# Patient Record
Sex: Female | Born: 1995 | Hispanic: Yes | Marital: Married | Smoking: Never smoker
Health system: Southern US, Community
[De-identification: ages and names within clinical notes are randomized; demographics above are authoritative.]

## PROBLEM LIST (undated history)

## (undated) DIAGNOSIS — E282 Polycystic ovarian syndrome: Secondary | ICD-10-CM

## (undated) DIAGNOSIS — J45909 Unspecified asthma, uncomplicated: Secondary | ICD-10-CM

## (undated) DIAGNOSIS — O139 Gestational [pregnancy-induced] hypertension without significant proteinuria, unspecified trimester: Secondary | ICD-10-CM

## (undated) HISTORY — PX: EYE SURGERY: SHX253

## (undated) HISTORY — DX: Gestational (pregnancy-induced) hypertension without significant proteinuria, unspecified trimester: O13.9

## (undated) HISTORY — DX: Polycystic ovarian syndrome: E28.2

---
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
## 2017-03-25 ENCOUNTER — Ambulatory Visit (INDEPENDENT_AMBULATORY_CARE_PROVIDER_SITE_OTHER): Payer: Medicaid Other | Admitting: Obstetrics

## 2017-03-25 ENCOUNTER — Other Ambulatory Visit: Payer: Self-pay

## 2017-03-25 ENCOUNTER — Encounter: Payer: Self-pay | Admitting: Obstetrics

## 2017-03-25 ENCOUNTER — Other Ambulatory Visit (HOSPITAL_COMMUNITY)
Admission: RE | Admit: 2017-03-25 | Discharge: 2017-03-25 | Disposition: A | Discharge status: Home / Self Care | Payer: Medicaid Other | Source: Ambulatory Visit | Attending: Obstetrics | Admitting: Obstetrics

## 2017-03-25 VITALS — BP 118/81 | HR 81 | Ht 71.0 in | Wt 232.0 lb

## 2017-03-25 DIAGNOSIS — Z3009 Encounter for other general counseling and advice on contraception: Secondary | ICD-10-CM

## 2017-03-25 DIAGNOSIS — E669 Obesity, unspecified: Secondary | ICD-10-CM

## 2017-03-25 DIAGNOSIS — Z01419 Encounter for gynecological examination (general) (routine) without abnormal findings: Secondary | ICD-10-CM

## 2017-03-25 NOTE — Progress Notes (Signed)
New GYN, presents to establish care. [facility]. Last unprotected sex was 02/13/17.  UPT today is

## 2017-03-25 NOTE — Progress Notes (Signed)
Subjective:        Danielle Padilla is a 21 y.o. female here for a routine exam.  Current complaints: None.    Personal health questionnaire:  Is patient Ashkenazi Jewish, have a family history of breast and/or ovarian cancer: no Is there a family history of uterine cancer diagnosed at age < 5250, gastrointestinal cancer, urinary tract cancer, family member who is a Personnel officerLynch syndrome-associated carrier: no Is the patient overweight and hypertensive, family history of diabetes, personal history of gestational diabetes, preeclampsia or PCOS: no Is patient over 3355, have PCOS,  family history of premature CHD under age 20, diabetes, smoke, have hypertension or peripheral artery disease:  no At any time, has a partner hit, kicked or otherwise hurt or frightened you?: no Over the past 2 weeks, have you felt down, depressed or hopeless?: no Over the past 2 weeks, have you felt little interest or pleasure in doing things?:no   Gynecologic History No LMP recorded. Contraception: condoms Last Pap: n/a. Results were: n/a Last mammogram: n/a. Results were: n/a  Obstetric History OB History  Gravida Para Term Preterm AB Living  0 0 0 0 0 0  SAB TAB Ectopic Multiple Live Births  0 0 0 0 0        Past Medical History:  Diagnosis Date  . Medical history non-contributory     Past Surgical History:  Procedure Laterality Date  . EYE SURGERY      No current outpatient medications on file. No Known Allergies  Social History   Tobacco Use  . Smoking status: Never Smoker  . Smokeless tobacco: Never Used  Substance Use Topics  . Alcohol use: No    Frequency: Never    Family History  Problem Relation Age of Onset  . Diabetes Mother   . Asthma Father   . Hypertension Father   . Asthma Brother   . Cancer Maternal Uncle   . Cancer Maternal Grandmother   . Diabetes Paternal Grandmother       Review of Systems  Constitutional: negative for fatigue and weight  loss Respiratory: negative for cough and wheezing Cardiovascular: negative for chest pain, fatigue and palpitations Gastrointestinal: negative for abdominal pain and change in bowel habits Musculoskeletal:negative for myalgias Neurological: negative for gait problems and tremors Behavioral/Psych: negative for abusive relationship, depression Endocrine: negative for temperature intolerance    Genitourinary:negative for abnormal menstrual periods, genital lesions, hot flashes, sexual problems and vaginal discharge Integument/breast: negative for breast lump, breast tenderness, nipple discharge and skin lesion(s)    Objective:       BP 118/81   Pulse 81   Ht 5\' 11"  (1.803 m)   Wt 232 lb (105.2 kg)   BMI 32.36 kg/m  General:   alert  Skin:   no rash or abnormalities  Lungs:   clear to auscultation bilaterally  Heart:   regular rate and rhythm, S1, S2 normal, no murmur, click, rub or gallop  Breasts:   normal without suspicious masses, skin or nipple changes or axillary nodes  Abdomen:  normal findings: no organomegaly, soft, non-tender and no hernia  Pelvis:  External genitalia: normal general appearance Urinary system: urethral meatus normal and bladder without fullness, nontender Vaginal: normal without tenderness, induration or masses Cervix: normal appearance Adnexa: normal bimanual exam Uterus: anteverted and non-tender, normal size   Lab Review Urine pregnancy test Labs reviewed yes Radiologic studies reviewed no  50% of 20 min visit spent on counseling and coordination of care.  Assessment and Plan:     1. Encounter for routine gynecological examination with Papanicolaou smear of cervix - doing well  2. Encounter for other general counseling and advice on contraception - wants Nexplanon  3. Obesity (BMI 30.0-34.9) - program that includes caloric reduction, exercise and behavioral modification recommended   Plan:    Education reviewed: calcium supplements,  depression evaluation, low fat, low cholesterol diet, safe sex/STD prevention, self breast exams, skin cancer screening and weight bearing exercise. Contraception: Nexplanon. Follow up in: 2 weeks.  Nexplanon Insertion.  No orders of the defined types were placed in this encounter.  No orders of the defined types were placed in this encounter.

## 2017-03-26 LAB — CERVICOVAGINAL ANCILLARY ONLY
Chlamydia: NEGATIVE
NEISSERIA GONORRHEA: NEGATIVE

## 2017-04-02 ENCOUNTER — Ambulatory Visit (INDEPENDENT_AMBULATORY_CARE_PROVIDER_SITE_OTHER): Payer: Medicaid Other | Admitting: Obstetrics

## 2017-04-02 ENCOUNTER — Encounter: Payer: Self-pay | Admitting: Obstetrics

## 2017-04-02 VITALS — BP 122/79 | HR 69 | Wt 230.0 lb

## 2017-04-02 DIAGNOSIS — Z309 Encounter for contraceptive management, unspecified: Secondary | ICD-10-CM

## 2017-04-02 DIAGNOSIS — Z01812 Encounter for preprocedural laboratory examination: Secondary | ICD-10-CM

## 2017-04-02 DIAGNOSIS — Z30017 Encounter for initial prescription of implantable subdermal contraceptive: Secondary | ICD-10-CM

## 2017-04-02 LAB — POCT URINE PREGNANCY: PREG TEST UR: NEGATIVE

## 2017-04-02 MED ORDER — ETONOGESTREL 68 MG ~~LOC~~ IMPL
68.0000 mg | DRUG_IMPLANT | Freq: Once | SUBCUTANEOUS | Status: AC
  Administered 2017-04-02: 68 mg via SUBCUTANEOUS

## 2017-04-02 NOTE — Progress Notes (Signed)
Nexplanon Procedure Note   PRE-OP DIAGNOSIS: desired long-term, reversible contraception ( LARC ) POST-OP DIAGNOSIS: Same  PROCEDURE: Nexplanon  placement Performing Provider: Brock BadHARLES A. Aizley Stenseth MD  Patient education prior to procedure, explained risk, benefits of Nexplanon, reviewed alternative options. Patient reported understanding. Gave consent to continue with procedure.   PROCEDURE:  Pregnancy Text :  Negative Site (check):      left arm         Sterile Preparation:   Betadinex3 Lot # [id number]   Expiration Date 01 / 2021  Insertion site was selected 8 - 10 cm from medial epicondyle and marked along with guiding site using sterile marker. Procedure area was prepped and draped in a sterile fashion. 1% Lidocaine 1.5 ml given prior to procedure. Nexplanon  was inserted subcutaneously.Needle was removed from the insertion site. Nexplanon capsule was palpated by provider and patient to assure satisfactory placement. Dressing applied.  Followup: The patient tolerated the procedure well without complications.  Standard post-procedure care is explained and return precautions are given.  Brock BadHARLES A. Mykaila Blunck MD

## 2017-04-03 ENCOUNTER — Encounter: Payer: Self-pay | Admitting: Obstetrics

## 2017-04-15 ENCOUNTER — Ambulatory Visit (INDEPENDENT_AMBULATORY_CARE_PROVIDER_SITE_OTHER): Payer: Medicaid Other | Admitting: Obstetrics

## 2017-04-15 ENCOUNTER — Encounter: Payer: Self-pay | Admitting: Obstetrics

## 2017-04-15 ENCOUNTER — Encounter: Payer: Self-pay | Admitting: *Deleted

## 2017-04-15 VITALS — BP 126/80 | HR 80 | Ht 71.0 in | Wt 228.6 lb

## 2017-04-15 DIAGNOSIS — Z309 Encounter for contraceptive management, unspecified: Secondary | ICD-10-CM | POA: Diagnosis not present

## 2017-04-15 DIAGNOSIS — Z3046 Encounter for surveillance of implantable subdermal contraceptive: Secondary | ICD-10-CM

## 2017-04-15 NOTE — Progress Notes (Signed)
Pt denies having any issues with nexplanon

## 2017-04-15 NOTE — Progress Notes (Signed)
Subjective:    Danielle Padilla is a 21 y.o. female who presents for contraception counseling. The patient has no complaints today. The patient is sexually active. Pertinent past medical history: none.  The information documented in the HPI was reviewed and verified.  Menstrual History: OB History    Gravida Para Term Preterm AB Living   0 0 0 0 0 0   SAB TAB Ectopic Multiple Live Births   0 0 0 0 0      No LMP recorded. Patient has had an implant.   There are no active problems to display for this patient.  Past Medical History:  Diagnosis Date  . Medical history non-contributory     Past Surgical History:  Procedure Laterality Date  . EYE SURGERY      No current outpatient medications on file. No Known Allergies  Social History   Tobacco Use  . Smoking status: Never Smoker  . Smokeless tobacco: Never Used  Substance Use Topics  . Alcohol use: No    Frequency: Never    Family History  Problem Relation Age of Onset  . Diabetes Mother   . Asthma Father   . Hypertension Father   . Asthma Brother   . Cancer Maternal Uncle   . Cancer Maternal Grandmother   . Diabetes Paternal Grandmother        Review of Systems Constitutional: negative for weight loss Genitourinary:negative for abnormal menstrual periods and vaginal discharge   Objective:   BP 126/80   Pulse 80   Ht 5\' 11"  (1.803 m)   Wt 228 lb 9.6 oz (103.7 kg)   BMI 31.88 kg/m    PE:          General:  Alert and no distress          Left Upper Extremity:  Nexplanon insertion site is clean, dry and non tender.  Rod palpated, intact.   Lab Review Urine pregnancy test Labs reviewed yes Radiologic studies reviewed no  50% of 15 min visit spent on counseling and coordination of care.    Assessment:    21 y.o., continuing Nexplanon, no contraindications.   Plan:    All questions answered. Follow up in 1 year.   No orders of the defined types were placed in this encounter.  No  orders of the defined types were placed in this encounter.

## 2018-02-03 ENCOUNTER — Ambulatory Visit: Payer: Medicaid Other | Admitting: Obstetrics & Gynecology

## 2018-04-02 ENCOUNTER — Encounter (HOSPITAL_COMMUNITY): Payer: Self-pay | Admitting: *Deleted

## 2018-04-02 ENCOUNTER — Ambulatory Visit (HOSPITAL_COMMUNITY)
Admission: EM | Admit: 2018-04-02 | Discharge: 2018-04-02 | Disposition: A | Discharge status: Home / Self Care | Payer: Self-pay | Attending: Family Medicine | Admitting: Family Medicine

## 2018-04-02 DIAGNOSIS — N390 Urinary tract infection, site not specified: Secondary | ICD-10-CM

## 2018-04-02 HISTORY — DX: Unspecified asthma, uncomplicated: J45.909

## 2018-04-02 LAB — POCT URINALYSIS DIP (DEVICE)
BILIRUBIN URINE: NEGATIVE
GLUCOSE, UA: NEGATIVE mg/dL
Ketones, ur: NEGATIVE mg/dL
Nitrite: POSITIVE — AB
Protein, ur: 100 mg/dL — AB
Specific Gravity, Urine: 1.025 (ref 1.005–1.030)
UROBILINOGEN UA: 0.2 mg/dL (ref 0.0–1.0)
pH: 6 (ref 5.0–8.0)

## 2018-04-02 LAB — POCT PREGNANCY, URINE: Preg Test, Ur: NEGATIVE

## 2018-04-02 MED ORDER — NITROFURANTOIN MONOHYD MACRO 100 MG PO CAPS: 100.0000 mg | ORAL_CAPSULE | Freq: Two times a day (BID) | ORAL | 0 refills | Status: AC

## 2018-04-02 NOTE — ED Triage Notes (Signed)
Patient reports progressively worsening dysuria, lower abdominal pain radiating to left flank, and starting yesterday hematuria. Patient states that she has increased PO intake but the pain was extreme this am causing her to wake up at 4a. No fever.

## 2018-04-02 NOTE — ED Provider Notes (Signed)
[facility]    CSN: [id number] Arrival date & time: 04/02/18  4098     History   Chief Complaint Chief Complaint  Patient presents with  . Dysuria  . Abdominal Pain  . Hematuria    HPI Latesia Virgilio Belling is a 22 y.o. female.   Kameela presents with complaints of burning with urination, some pelvic pain. Started two days ago. Blood noted to urine yesterday. Denies any previous similar. No nausea or vomiting. Mild left low back pain. No fevers. LMP in July, recently had her implant removed. No vaginal symptoms. Took AZO yesterday which did help. Hx of asthma.     ROS per HPI.      Past Medical History:  Diagnosis Date  . Asthma   . Medical history non-contributory     There are no active problems to display for this patient.   Past Surgical History:  Procedure Laterality Date  . EYE SURGERY      OB History    Gravida  0   Para  0   Term  0   Preterm  0   AB  0   Living  0     SAB  0   TAB  0   Ectopic  0   Multiple  0   Live Births  0            Home Medications    Prior to Admission medications   Medication Sig Start Date End Date Taking? Authorizing Provider  nitrofurantoin, macrocrystal-monohydrate, (MACROBID) 100 MG capsule Take 1 capsule (100 mg total) by mouth 2 (two) times daily for 5 days. 04/02/18 04/07/18  Georgetta Haber, NP    Family History Family History  Problem Relation Age of Onset  . Diabetes Mother   . Asthma Father   . Hypertension Father   . Asthma Brother   . Cancer Maternal Uncle   . Cancer Maternal Grandmother   . Diabetes Paternal Grandmother     Social History Social History   Tobacco Use  . Smoking status: Never Smoker  . Smokeless tobacco: Never Used  Substance Use Topics  . Alcohol use: No    Frequency: Never  . Drug use: No     Allergies   Patient has no known allergies.   Review of Systems Review of Systems   Physical Exam Triage Vital Signs ED Triage  Vitals  Enc Vitals Group     BP 04/02/18 0909 127/85     Pulse Rate 04/02/18 0909 89     Resp 04/02/18 0909 17     Temp 04/02/18 0909 (!) 97.5 F (36.4 C)     Temp Source 04/02/18 0909 Oral     SpO2 04/02/18 0909 99 %     Weight --      Height --      Head Circumference --      Peak Flow --      Pain Score 04/02/18 0910 7     Pain Loc --      Pain Edu? --      Excl. in GC? --    No data found.  Updated Vital Signs BP 127/85 (BP Location: Left Arm)   Pulse 89   Temp (!) 97.5 F (36.4 C) (Oral)   Resp 17   SpO2 99%   Physical Exam  Constitutional: She is oriented to person, place, and time. She appears well-developed and well-nourished. No distress.  Cardiovascular: Normal rate, regular rhythm and  normal heart sounds.  Pulmonary/Chest: Effort normal and breath sounds normal.  Abdominal: Soft. There is no tenderness. There is no rigidity, no rebound, no guarding and no CVA tenderness.  Neurological: She is alert and oriented to person, place, and time.  Skin: Skin is warm and dry.     UC Treatments / Results  Labs (all labs ordered are listed, but only abnormal results are displayed) Labs Reviewed  POCT URINALYSIS DIP (DEVICE) - Abnormal; Notable for the following components:      Result Value   Hgb urine dipstick LARGE (*)    Protein, ur 100 (*)    Nitrite POSITIVE (*)    Leukocytes, UA MODERATE (*)    All other components within normal limits  POCT PREGNANCY, URINE    EKG None  Radiology No results found.  Procedures Procedures (including critical care time)  Medications Ordered in UC Medications - No data to display  Initial Impression / Assessment and Plan / UC Course  I have reviewed the triage vital signs and the nursing notes.  Pertinent labs & imaging results that were available during my care of the patient were reviewed by me and considered in my medical decision making (see chart for details).     UA and history consistent with UTI.  Course of macrobid provided. Return precautions provided. If symptoms worsen or do not improve in the next week to return to be seen or to follow up with PCP.  Patient verbalized understanding and agreeable to plan.   Final Clinical Impressions(s) / UC Diagnoses   Final diagnoses:  Lower urinary tract infectious disease     Discharge Instructions     Drink plenty of water to empty bladder regularly. Avoid alcohol and caffeine as these may irritate the bladder.  Complete course of antibiotics.  If symptoms worsen or do not improve in the next week to return to be seen or to follow up with your PCP.     ED Prescriptions    Medication Sig Dispense Auth. Provider   nitrofurantoin, macrocrystal-monohydrate, (MACROBID) 100 MG capsule Take 1 capsule (100 mg total) by mouth 2 (two) times daily for 5 days. 10 capsule Georgetta HaberBurky, Nitya Cauthon B, NP     Controlled Substance Prescriptions [place] Controlled Substance Registry consulted? Not Applicable   Georgetta HaberBurky, Pablo Mathurin B, NP 04/02/18 1202

## 2018-04-02 NOTE — Discharge Instructions (Signed)
Drink plenty of water to empty bladder regularly. Avoid alcohol and caffeine as these may irritate the bladder.  Complete course of antibiotics.  If symptoms worsen or do not improve in the next week to return to be seen or to follow up with your PCP.   

## 2018-05-14 ENCOUNTER — Other Ambulatory Visit: Payer: Self-pay | Admitting: Family Medicine

## 2018-05-14 MED ORDER — NITROFURANTOIN MONOHYD MACRO 100 MG PO CAPS: 100.0000 mg | ORAL_CAPSULE | Freq: Two times a day (BID) | ORAL | 0 refills | Status: DC

## 2018-05-14 NOTE — Progress Notes (Signed)
Microbid started for recurrent uti

## 2018-06-21 ENCOUNTER — Ambulatory Visit (INDEPENDENT_AMBULATORY_CARE_PROVIDER_SITE_OTHER): Payer: No Typology Code available for payment source | Admitting: Family Medicine

## 2018-06-21 ENCOUNTER — Other Ambulatory Visit: Payer: Self-pay

## 2018-06-21 ENCOUNTER — Encounter: Payer: Self-pay | Admitting: Family Medicine

## 2018-06-21 VITALS — BP 125/86 | HR 80 | Temp 98.7°F | Ht 68.0 in | Wt 247.8 lb

## 2018-06-21 DIAGNOSIS — N926 Irregular menstruation, unspecified: Secondary | ICD-10-CM | POA: Diagnosis not present

## 2018-06-21 DIAGNOSIS — Z113 Encounter for screening for infections with a predominantly sexual mode of transmission: Secondary | ICD-10-CM

## 2018-06-21 DIAGNOSIS — Z131 Encounter for screening for diabetes mellitus: Secondary | ICD-10-CM | POA: Diagnosis not present

## 2018-06-21 LAB — POCT URINE PREGNANCY: Preg Test, Ur: NEGATIVE

## 2018-06-21 NOTE — Patient Instructions (Signed)
° ° ° °  If you have lab work done today you will be contacted with your lab results within the next 2 weeks.  If you have not heard from us then please contact us. The fastest way to get your results is to register for My Chart. ° ° °IF you received an x-ray today, you will receive an invoice from [facility]. Please contact [facility] at [phone] with questions or concerns regarding your invoice.  ° °IF you received labwork today, you will receive an invoice from [facility]. Please contact [facility] at [phone] with questions or concerns regarding your invoice.  ° °Our billing staff will not be able to assist you with questions regarding bills from these companies. ° °You will be contacted with the lab results as soon as they are available. The fastest way to get your results is to activate your My Chart account. Instructions are located on the last page of this paperwork. If you have not heard from us regarding the results in 2 weeks, please contact this office. °  ° ° ° °

## 2018-06-21 NOTE — Progress Notes (Signed)
2/3/20203:43 PM  Danielle Padilla [birth year], 23 y.o. female [id number]  Chief Complaint  Patient presents with  . Establish Care    pt ttc, also wants sti testing. Wants to talk about the symptoms of pcos. Family member has this condition. Has not had cycle in a few months. Removed nexplanon 02/2018    HPI:   Patient is a 23 y.o. female who presents today with several concerns  Patient requesting STI testing, has no symptoms, just wanting to be thorough nexplanon removed in oct 2019, as she and her husband are trying to conceive G0 Prior to nexplanon she had irregular periods When she gets them they last week, normal menses Wondering about PCOS, her cousin has it Reports facial and chest chair and irregular periods On prenatal vitamins   Fall Risk  06/21/2018  Falls in the past year? 0  Number falls in past yr: 0  Injury with Fall? 0     Depression screen [facility] 2/9 06/21/2018 03/25/2017  Decreased Interest 0 0  Down, Depressed, Hopeless 0 0  PHQ - 2 Score 0 0  Altered sleeping - 0  Tired, decreased energy - 0  Change in appetite - 0  Feeling bad or failure about yourself  - 0  Trouble concentrating - 0  Moving slowly or fidgety/restless - 0  Suicidal thoughts - 0  PHQ-9 Score - 0  Difficult doing work/chores - Not difficult at all    No Known Allergies  Prior to Admission medications   Not on File    Past Medical History:  Diagnosis Date  . Asthma   . Medical history non-contributory     Past Surgical History:  Procedure Laterality Date  . EYE SURGERY      Social History   Tobacco Use  . Smoking status: Never Smoker  . Smokeless tobacco: Never Used  Substance Use Topics  . Alcohol use: No    Frequency: Never    Family History  Problem Relation Age of Onset  . Diabetes Mother   . Asthma Father   . Hypertension Father   . Asthma Brother   . Cancer Maternal Uncle   . Cancer Maternal Grandmother   . Diabetes Paternal Grandmother      ROS Per hpi  OBJECTIVE:  Blood pressure 125/86, pulse 80, temperature 98.7 F (37.1 C), temperature source Oral, height 5\' 8"  (1.727 m), weight 247 lb 12.8 oz (112.4 kg), last menstrual period 11/16/2017, SpO2 100 %. Body mass index is 37.68 kg/m.   Physical Exam Vitals signs and nursing note reviewed.  Constitutional:      Appearance: She is well-developed.  HENT:     Head: Normocephalic and atraumatic.  Eyes:     General: No scleral icterus.    Conjunctiva/sclera: Conjunctivae normal.     Pupils: Pupils are equal, round, and reactive to light.  Neck:     Musculoskeletal: Neck supple.  Pulmonary:     Effort: Pulmonary effort is normal.  Skin:    General: Skin is warm and dry.  Neurological:     Mental Status: She is alert and oriented to person, place, and time.       Results for orders placed or performed in visit on 06/21/18 (from the past 24 hour(s))  POCT urine pregnancy     Status: None   Collection Time: 06/21/18  3:52 PM  Result Value Ref Range   Preg Test, Ur Negative Negative   ASSESSMENT and PLAN  1. Routine screening for  STI (sexually transmitted infection) - HIV Antibody (routine testing w rflx) - RPR - GC/Chlamydia Probe Amp - Trichomonas vaginalis, RNA - Hepatitis C antibody  2. Irregular periods Clinically c/w PCOS. Labs to r/o other endo causes. Discussed possible referral to gyn given interest in fertility, pending labs - POCT urine pregnancy - TSH - Prolactin - Follicle Stimulating Hormone - 17-Hydroxyprogesterone; Future  3. Screening for diabetes mellitus (DM) - Hemoglobin A1c    Return if symptoms worsen or fail to improve.    Myles LippsIrma M Santiago, MD [facility] at [facility] [address] [place], [place] [address] Ph.  [phone] Fax [phone]

## 2018-06-22 LAB — HEPATITIS C ANTIBODY: Hep C Virus Ab: <0.1 s/co ratio (ref 0.0–0.9)

## 2018-06-22 LAB — HIV ANTIBODY (ROUTINE TESTING W REFLEX): HIV Screen 4th Generation wRfx: NONREACTIVE

## 2018-06-22 LAB — PROLACTIN: Prolactin: 19.4 ng/mL (ref 4.8–23.3)

## 2018-06-22 LAB — HEMOGLOBIN A1C
Est. average glucose Bld gHb Est-mCnc: 97 mg/dL
Hgb A1c MFr Bld: 5 % (ref 4.8–5.6)

## 2018-06-22 LAB — TSH: TSH: 1.4 u[IU]/mL (ref 0.450–4.500)

## 2018-06-22 LAB — FOLLICLE STIMULATING HORMONE: FSH: 4.2 m[IU]/mL

## 2018-06-22 LAB — RPR: RPR Ser Ql: NONREACTIVE

## 2018-06-25 ENCOUNTER — Encounter: Payer: Self-pay | Admitting: Family Medicine

## 2018-06-25 DIAGNOSIS — N926 Irregular menstruation, unspecified: Secondary | ICD-10-CM

## 2018-06-25 LAB — TRICHOMONAS VAGINALIS, PROBE AMP: Trich vag by NAA: NEGATIVE

## 2018-06-25 LAB — GC/CHLAMYDIA PROBE AMP
Chlamydia trachomatis, NAA: NEGATIVE
Neisseria gonorrhoeae by PCR: NEGATIVE

## 2018-07-03 ENCOUNTER — Ambulatory Visit (INDEPENDENT_AMBULATORY_CARE_PROVIDER_SITE_OTHER): Payer: No Typology Code available for payment source | Admitting: Family Medicine

## 2018-07-03 DIAGNOSIS — N926 Irregular menstruation, unspecified: Secondary | ICD-10-CM

## 2018-07-03 NOTE — Progress Notes (Signed)
Lab only visit 

## 2018-07-07 LAB — 17-HYDROXYPROGESTERONE: 17-Hydroxyprogesterone: 90 ng/dL

## 2018-07-07 LAB — TESTOSTERONE, FREE, TOTAL, SHBG
Sex Hormone Binding: 23.2 nmol/L — ABNORMAL LOW (ref 24.6–122.0)
Testosterone, Free: 3.9 pg/mL (ref 0.0–4.2)
Testosterone: 57 ng/dL — ABNORMAL HIGH (ref 8–48)

## 2018-07-07 LAB — DHEA-SULFATE: DHEA-SO4: 283.7 ug/dL (ref 110.0–431.7)

## 2018-07-08 ENCOUNTER — Ambulatory Visit (HOSPITAL_COMMUNITY)
Admission: RE | Admit: 2018-07-08 | Discharge: 2018-07-08 | Disposition: A | Discharge status: Home / Self Care | Payer: No Typology Code available for payment source | Source: Ambulatory Visit | Attending: Family Medicine | Admitting: Family Medicine

## 2018-07-08 DIAGNOSIS — N926 Irregular menstruation, unspecified: Secondary | ICD-10-CM | POA: Insufficient documentation

## 2018-07-12 ENCOUNTER — Encounter: Payer: Self-pay | Admitting: Family Medicine

## 2018-07-19 ENCOUNTER — Encounter: Payer: Self-pay | Admitting: Family Medicine

## 2018-07-19 ENCOUNTER — Other Ambulatory Visit: Payer: Self-pay

## 2018-07-19 ENCOUNTER — Ambulatory Visit (INDEPENDENT_AMBULATORY_CARE_PROVIDER_SITE_OTHER): Payer: No Typology Code available for payment source | Admitting: Family Medicine

## 2018-07-19 VITALS — BP 117/83 | HR 80 | Temp 98.8°F | Ht 68.0 in | Wt 252.8 lb

## 2018-07-19 DIAGNOSIS — E282 Polycystic ovarian syndrome: Secondary | ICD-10-CM

## 2018-07-19 HISTORY — DX: Polycystic ovarian syndrome: E28.2

## 2018-07-19 MED ORDER — METFORMIN HCL 500 MG PO TABS: 500.0000 mg | ORAL_TABLET | Freq: Every day | ORAL | 1 refills | Status: DC

## 2018-07-19 NOTE — Progress Notes (Signed)
3/2/20204:47 PM  Danielle Padilla 1996-02-12, 23 y.o. female [id number]  Chief Complaint  Patient presents with  . Follow-up    here to discuss lab work, did have perion on the 5th of Feb, lasted for 1 wk    HPI:   Patient is a 23 y.o. female with past medical history significant for irregular period and hirsutism who presents today for lab and [place] results   Reviewed lab results Mild elevation of testosterone Normal  A1c, TSH, prolactin and 17OH Normal pelvic US She desires fertility Skipped jan, normal period in feb, having some cramping as if she is going to get her period Taking gummy prenatal vitamins  Fall Risk  07/19/2018 06/21/2018  Falls in the past year? 0 0  Number falls in past yr: 0 0  Injury with Fall? - 0     Depression screen [facility] 2/9 07/19/2018 06/21/2018 03/25/2017  Decreased Interest 0 0 0  Down, Depressed, Hopeless 0 0 0  PHQ - 2 Score 0 0 0  Altered sleeping - - 0  Tired, decreased energy - - 0  Change in appetite - - 0  Feeling bad or failure about yourself  - - 0  Trouble concentrating - - 0  Moving slowly or fidgety/restless - - 0  Suicidal thoughts - - 0  PHQ-9 Score - - 0  Difficult doing work/chores - - Not difficult at all    No Known Allergies  Prior to Admission medications   Not on File    Past Medical History:  Diagnosis Date  . Asthma   . Medical history non-contributory     Past Surgical History:  Procedure Laterality Date  . EYE SURGERY      Social History   Tobacco Use  . Smoking status: Never Smoker  . Smokeless tobacco: Never Used  Substance Use Topics  . Alcohol use: No    Frequency: Never    Family History  Problem Relation Age of Onset  . Diabetes Mother   . Asthma Father   . Hypertension Father   . Asthma Brother   . Cancer Maternal Uncle   . Cancer Maternal Grandmother   . Diabetes Paternal Grandmother     ROS Per hpi  OBJECTIVE:  Blood pressure 117/83, pulse 80, temperature 98.8 F (37.1  C), temperature source Oral, height 5\' 8"  (1.727 m), weight 252 lb 12.8 oz (114.7 kg), SpO2 97 %. Body mass index is 38.44 kg/m.   Physical Exam Vitals signs and nursing note reviewed.  Constitutional:      Appearance: She is well-developed.  HENT:     Head: Normocephalic and atraumatic.  Eyes:     General: No scleral icterus.    Conjunctiva/sclera: Conjunctivae normal.     Pupils: Pupils are equal, round, and reactive to light.  Neck:     Musculoskeletal: Neck supple.  Pulmonary:     Effort: Pulmonary effort is normal.  Skin:    General: Skin is warm and dry.  Neurological:     Mental Status: She is alert and oriented to person, place, and time.      ASSESSMENT and PLAN  1. PCOS (polycystic ovarian syndrome) New diagnosis. More then 50% of this 25 min visit was spent on counseling and coordination of care. Discussed with patient importance of LFM and weight loss, regulation of cycles, metformin and fertility. Advised seeking care of obgyn as fertility desired, in case unable to conceive with metformin. Discussed importance of PNV and healthy lifestyle.  Other orders - metFORMIN (GLUCOPHAGE) 500 MG tablet; Take 1 tablet (500 mg total) by mouth daily with breakfast.  Return in about 3 months (around 10/19/2018).    Myles Lipps, MD Primary Care at [facility] [address] [place], [place] [address] Ph.  [phone] Fax [phone]

## 2018-07-19 NOTE — Patient Instructions (Addendum)
If you have lab work done today you will be contacted with your lab results within the next 2 weeks.  If you have not heard from [place] then please contact us. The fastest way to get your results is to register for My Chart.   IF you received an x-ray today, you will receive an invoice from [facility]. Please contact [facility] at [phone] with questions or concerns regarding your invoice.   IF you received labwork today, you will receive an invoice from [facility]. Please contact [facility] at [phone] with questions or concerns regarding your invoice.   Our billing staff will not be able to assist you with questions regarding bills from these companies.  You will be contacted with the lab results as soon as they are available. The fastest way to get your results is to activate your My Chart account. Instructions are located on the last page of this paperwork. If you have not heard from [place] regarding the results in 2 weeks, please contact this office.     Diet for Polycystic Ovary Syndrome Polycystic ovary syndrome (PCOS) is a disorder of the chemicals (hormones) that regulate a woman's reproductive system, including monthly periods (menstruation). The condition causes important hormones to be out of balance. PCOS can:  Stop your periods or make them irregular.  Cause cysts to develop on your ovaries.  Make it difficult to get pregnant.  Stop your body from responding to the effects of insulin (insulin resistance). Insulin resistance can lead to obesity and diabetes. Changing what you eat can help you manage PCOS and improve your health. Following a balanced diet can help you lose weight and improve the way that your body uses insulin. What are tips for following this plan?  Follow a balanced diet for meals and snacks. Eat breakfast, lunch, dinner, and one or two snacks every day.  Include protein in each meal and snack.  Choose whole grains instead of  products that are made with refined flour.  Eat a variety of foods.  Exercise regularly as told by your health care provider. Aim to do 30 or more minutes of exercise on most days of the week.  If you are overweight or obese: ? Pay attention to how many calories you eat. Cutting down on calories can help you lose weight. ? Work with your health care provider or a diet and nutrition specialist (dietitian) to figure out how many calories you need each day. What foods can I eat?  Fruits Include a variety of colors and types. All fruits are helpful for PCOS. Vegetables Include a variety of colors and types. All vegetables are helpful for PCOS. Grains Whole grains, such as whole wheat. Whole-grain breads, crackers, cereals, and pasta. Unsweetened oatmeal, bulgur, barley, quinoa, and brown rice. Tortillas made from corn or whole-wheat flour. Meats and other proteins Low-fat (lean) proteins, such as fish, chicken, beans, eggs, and tofu. Dairy Low-fat dairy products, such as skim milk, cheese sticks, and yogurt. Beverages Low-fat or fat-free drinks, such as water, low-fat milk, sugar-free drinks, and small amounts of 100% fruit juice. Seasonings and condiments Ketchup. Mustard. Barbecue sauce. Relish. Low-fat or fat-free mayonnaise. Fats and oils Olive oil or canola oil. Walnuts and almonds. The items listed above may not be a complete list of recommended foods and beverages. Contact a dietitian for more options. What foods are not recommended? Foods that are high in calories or fat. Fried foods. Sweets. Products that are made from refined white flour,  including white bread, pastries, white rice, and pasta. The items listed above may not be a complete list of foods and beverages to avoid. Contact a dietitian for more information. Summary  PCOS is a hormonal imbalance that affects a woman's reproductive system.  You can help to manage your PCOS by exercising regularly and eating a healthy,  varied diet of vegetables, fruit, whole grains, low-fat (lean) protein, and low-fat dairy products.  Changing what you eat can improve the way that your body uses insulin, help your hormones reach normal levels, and help you lose weight. This information is not intended to replace advice given to you by your health care provider. Make sure you discuss any questions you have with your health care provider. Document Released: 08/27/2015 Document Revised: 03/09/2017 Document Reviewed: 03/09/2017 Elsevier Interactive Patient Education  2019 [employer]  Polycystic Ovarian Syndrome  Polycystic ovarian syndrome (PCOS) is a common hormonal disorder among women of reproductive age. In most women with PCOS, many small fluid-filled sacs (cysts) grow on the ovaries, and the cysts are not part of a normal menstrual cycle. PCOS can cause problems with your menstrual periods and make it difficult to get pregnant. It can also cause an increased risk of miscarriage with pregnancy. If it is not treated, PCOS can lead to serious health problems, such as diabetes and heart disease. What are the causes? The cause of PCOS is not known, but it may be the result of a combination of certain factors, such as:  Irregular menstrual cycle.  High levels of certain hormones (androgens).  Problems with the hormone that helps to control blood sugar (insulin resistance).  Certain genes. What increases the risk? This condition is more likely to develop in women who have a family history of PCOS. What are the signs or symptoms? Symptoms of PCOS may include:  Multiple ovarian cysts.  Infrequent periods or no periods.  Periods that are too frequent or too heavy.  Unpredictable periods.  Inability to get pregnant (infertility) because of not ovulating.  Increased growth of hair on the face, chest, stomach, back, thumbs, thighs, or toes.  Acne or oily skin. Acne may develop during adulthood, and it may not respond  to treatment.  Pelvic pain.  Weight gain or obesity.  Patches of thickened and dark brown or black skin on the neck, arms, breasts, or thighs (acanthosis nigricans).  Excess hair growth on the face, chest, abdomen, or upper thighs (hirsutism). How is this diagnosed? This condition is diagnosed based on:  Your medical history.  A physical exam, including a pelvic exam. Your health care provider may look for areas of increased hair growth on your skin.  Tests, such as: ? Ultrasound. This may be used to examine the ovaries and the lining of the uterus (endometrium) for cysts. ? Blood tests. These may be used to check levels of sugar (glucose), female hormone (testosterone), and female hormones (estrogen and progesterone) in your blood. How is this treated? There is no cure for PCOS, but treatment can help to manage symptoms and prevent more health problems from developing. Treatment varies depending on:  Your symptoms.  Whether you want to have a baby or whether you need birth control (contraception). Treatment may include nutrition and lifestyle changes along with:  Progesterone hormone to start a menstrual period.  Birth control pills to help you have regular menstrual periods.  Medicines to make you ovulate, if you want to get pregnant.  Medicine to reduce excessive hair growth.  Surgery,  in severe cases. This may involve making small holes in one or both of your ovaries. This decreases the amount of testosterone that your body produces. Follow these instructions at home:  Take over-the-counter and prescription medicines only as told by your health care provider.  Follow a healthy meal plan. This can help you reduce the effects of PCOS. ? Eat a healthy diet that includes lean proteins, complex carbohydrates, fresh fruits and vegetables, low-fat dairy products, and healthy fats. Make sure to eat enough fiber.  If you are overweight, lose weight as told by your health care  provider. ? Losing 10% of your body weight may improve symptoms. ? Your health care provider can determine how much weight loss is best for you and can help you lose weight safely.  Keep all follow-up visits as told by your health care provider. This is important. Contact a health care provider if:  Your symptoms do not get better with medicine.  You develop new symptoms. This information is not intended to replace advice given to you by your health care provider. Make sure you discuss any questions you have with your health care provider. Document Released: 08/29/2004 Document Revised: 01/01/2016 Document Reviewed: 10/21/2015 Elsevier Interactive Patient Education  2019 [employer].

## 2018-10-06 ENCOUNTER — Telehealth: Payer: No Typology Code available for payment source | Admitting: Family Medicine

## 2018-10-22 ENCOUNTER — Encounter: Payer: Self-pay | Admitting: Family Medicine

## 2018-10-22 ENCOUNTER — Other Ambulatory Visit: Payer: Self-pay

## 2018-10-22 ENCOUNTER — Ambulatory Visit (INDEPENDENT_AMBULATORY_CARE_PROVIDER_SITE_OTHER): Payer: No Typology Code available for payment source | Admitting: Family Medicine

## 2018-10-22 VITALS — BP 120/84 | HR 92 | Temp 98.6°F

## 2018-10-22 DIAGNOSIS — N926 Irregular menstruation, unspecified: Secondary | ICD-10-CM | POA: Diagnosis not present

## 2018-10-22 NOTE — Patient Instructions (Addendum)
If you have lab work done today you will be contacted with your lab results within the next 2 weeks.  If you have not heard from [place] then please contact us. The fastest way to get your results is to register for My Chart.   IF you received an x-ray today, you will receive an invoice from [facility]. Please contact [facility] at [phone] with questions or concerns regarding your invoice.   IF you received labwork today, you will receive an invoice from [place]. Please contact [facility] at [phone] with questions or concerns regarding your invoice.   Our billing staff will not be able to assist you with questions regarding bills from these companies.  You will be contacted with the lab results as soon as they are available. The fastest way to get your results is to activate your My Chart account. Instructions are located on the last page of this paperwork. If you have not heard from [place] regarding the results in 2 weeks, please contact this office.     Calorie Counting for Weight Loss Calories are units of energy. Your body needs a certain amount of calories from food to keep you going throughout the day. When you eat more calories than your body needs, your body stores the extra calories as fat. When you eat fewer calories than your body needs, your body burns fat to get the energy it needs. Calorie counting means keeping track of how many calories you eat and drink each day. Calorie counting can be helpful if you need to lose weight. If you make sure to eat fewer calories than your body needs, you should lose weight. Ask your health care provider what a healthy weight is for you. For calorie counting to work, you will need to eat the right number of calories in a day in order to lose a healthy amount of weight per week. A dietitian can help you determine how many calories you need in a day and will give you suggestions on how to reach your calorie goal.  A healthy  amount of weight to lose per week is usually 1-2 lb (0.5-0.9 kg). This usually means that your daily calorie intake should be reduced by 500-750 calories.  Eating 1,200 - 1,500 calories per day can help most women lose weight.  Eating 1,500 - 1,800 calories per day can help most men lose weight. What is my plan? My goal is to have _______1900___ calories per day. If I have this many calories per day, I should lose around _1_________ pounds per week. What do I need to know about calorie counting? In order to meet your daily calorie goal, you will need to:  Find out how many calories are in each food you would like to eat. Try to do this before you eat.  Decide how much of the food you plan to eat.  Write down what you ate and how many calories it had. Doing this is called keeping a food log. To successfully lose weight, it is important to balance calorie counting with a healthy lifestyle that includes regular activity. Aim for 150 minutes of moderate exercise (such as walking) or 75 minutes of vigorous exercise (such as running) each week. Where do I find calorie information?  The number of calories in a food can be found on a Nutrition Facts label. If a food does not have a Nutrition Facts label, try to look up the calories online or ask your dietitian for  help. Remember that calories are listed per serving. If you choose to have more than one serving of a food, you will have to multiply the calories per serving by the amount of servings you plan to eat. For example, the label on a package of bread might say that a serving size is 1 slice and that there are 90 calories in a serving. If you eat 1 slice, you will have eaten 90 calories. If you eat 2 slices, you will have eaten 180 calories. How do I keep a food log? Immediately after each meal, record the following information in your food log:  What you ate. Don't forget to include toppings, sauces, and other extras on the food.  How much  you ate. This can be measured in cups, ounces, or number of items.  How many calories each food and drink had.  The total number of calories in the meal. Keep your food log near you, such as in a small notebook in your pocket, or use a mobile app or website. Some programs will calculate calories for you and show you how many calories you have left for the day to meet your goal. What are some calorie counting tips?   Use your calories on foods and drinks that will fill you up and not leave you hungry: ? Some examples of foods that fill you up are nuts and nut butters, vegetables, lean proteins, and high-fiber foods like whole grains. High-fiber foods are foods with more than 5 g fiber per serving. ? Drinks such as sodas, specialty coffee drinks, alcohol, and juices have a lot of calories, yet do not fill you up.  Eat nutritious foods and avoid empty calories. Empty calories are calories you get from foods or beverages that do not have many vitamins or protein, such as candy, sweets, and soda. It is better to have a nutritious high-calorie food (such as an avocado) than a food with few nutrients (such as a bag of chips).  Know how many calories are in the foods you eat most often. This will help you calculate calorie counts faster.  Pay attention to calories in drinks. Low-calorie drinks include water and unsweetened drinks.  Pay attention to nutrition labels for "low fat" or "fat free" foods. These foods sometimes have the same amount of calories or more calories than the full fat versions. They also often have added sugar, starch, or salt, to make up for flavor that was removed with the fat.  Find a way of tracking calories that works for you. Get creative. Try different apps or programs if writing down calories does not work for you. What are some portion control tips?  Know how many calories are in a serving. This will help you know how many servings of a certain food you can have.  Use a  measuring cup to measure serving sizes. You could also try weighing out portions on a kitchen scale. With time, you will be able to estimate serving sizes for some foods.  Take some time to put servings of different foods on your favorite plates, bowls, and cups so you know what a serving looks like.  Try not to eat straight from a bag or box. Doing this can lead to overeating. Put the amount you would like to eat in a cup or on a plate to make sure you are eating the right portion.  Use smaller plates, glasses, and bowls to prevent overeating.  Try not to multitask (for example,  watch TV or use your computer) while eating. If it is time to eat, sit down at a table and enjoy your food. This will help you to know when you are full. It will also help you to be aware of what you are eating and how much you are eating. What are tips for following this plan? Reading food labels  Check the calorie count compared to the serving size. The serving size may be smaller than what you are used to eating.  Check the source of the calories. Make sure the food you are eating is high in vitamins and protein and low in saturated and trans fats. Shopping  Read nutrition labels while you shop. This will help you make healthy decisions before you decide to purchase your food.  Make a grocery list and stick to it. Cooking  Try to cook your favorite foods in a healthier way. For example, try baking instead of frying.  Use low-fat dairy products. Meal planning  Use more fruits and vegetables. Half of your plate should be fruits and vegetables.  Include lean proteins like poultry and fish. How do I count calories when eating out?  Ask for smaller portion sizes.  Consider sharing an entree and sides instead of getting your own entree.  If you get your own entree, eat only half. Ask for a box at the beginning of your meal and put the rest of your entree in it so you are not tempted to eat it.  If calories  are listed on the menu, choose the lower calorie options.  Choose dishes that include vegetables, fruits, whole grains, low-fat dairy products, and lean protein.  Choose items that are boiled, broiled, grilled, or steamed. Stay away from items that are buttered, battered, fried, or served with cream sauce. Items labeled "crispy" are usually fried, unless stated otherwise.  Choose water, low-fat milk, unsweetened iced tea, or other drinks without added sugar. If you want an alcoholic beverage, choose a lower calorie option such as a glass of wine or light beer.  Ask for dressings, sauces, and syrups on the side. These are usually high in calories, so you should limit the amount you eat.  If you want a salad, choose a garden salad and ask for grilled meats. Avoid extra toppings like bacon, cheese, or fried items. Ask for the dressing on the side, or ask for olive oil and vinegar or lemon to use as dressing.  Estimate how many servings of a food you are given. For example, a serving of cooked rice is  cup or about the size of half a baseball. Knowing serving sizes will help you be aware of how much food you are eating at restaurants. The list below tells you how big or small some common portion sizes are based on everyday objects: ? 1 oz-4 stacked dice. ? 3 oz-1 deck of cards. ? 1 tsp-1 die. ? 1 Tbsp- a ping-pong ball. ? 2 Tbsp-1 ping-pong ball. ?  cup- baseball. ? 1 cup-1 baseball. Summary  Calorie counting means keeping track of how many calories you eat and drink each day. If you eat fewer calories than your body needs, you should lose weight.  A healthy amount of weight to lose per week is usually 1-2 lb (0.5-0.9 kg). This usually means reducing your daily calorie intake by 500-750 calories.  The number of calories in a food can be found on a Nutrition Facts label. If a food does not have a Nutrition Facts  label, try to look up the calories online or ask your dietitian for help.  Use  your calories on foods and drinks that will fill you up, and not on foods and drinks that will leave you hungry.  Use smaller plates, glasses, and bowls to prevent overeating. This information is not intended to replace advice given to you by your health care provider. Make sure you discuss any questions you have with your health care provider. Document Released: 05/05/2005 Document Revised: 01/22/2018 Document Reviewed: 04/04/2016 Elsevier Interactive Patient Education  2019 [employer].

## 2018-10-22 NOTE — Progress Notes (Signed)
6/5/20202:31 PM  Danielle Padilla 12/11/1995, 23 y.o., female [id number]  Chief Complaint  Patient presents with  . Medication Reaction    has been on and off of the metformin due to the diarrhea it is causing. Has not taken any this wk. Still ttc since Feb 2020 having some anxiety with that    HPI:   Patient is a 23 y.o. female with past medical history significant for PCOS who presents today for routine followup  Last OV Feb 2020 Started on low dose metformin However had to stop due to significant diarrhea Last period in April - normal duration but very light Trying to conceive since Oct 2019, starting to get frustrated Trying to eat healthier Labs done in Feb 2020  Fall Risk  10/22/2018 07/19/2018 06/21/2018  Falls in the past year? 0 0 0  Number falls in past yr: 0 0 0  Injury with Fall? 0 - 0     Depression screen [facility] 2/9 10/22/2018 07/19/2018 06/21/2018  Decreased Interest 0 0 0  Down, Depressed, Hopeless 0 0 0  PHQ - 2 Score 0 0 0  Altered sleeping - - -  Tired, decreased energy - - -  Change in appetite - - -  Feeling bad or failure about yourself  - - -  Trouble concentrating - - -  Moving slowly or fidgety/restless - - -  Suicidal thoughts - - -  PHQ-9 Score - - -  Difficult doing work/chores - - -    No Known Allergies  Prior to Admission medications   Medication Sig Start Date End Date Taking? Authorizing Provider  metFORMIN (GLUCOPHAGE) 500 MG tablet Take 1 tablet (500 mg total) by mouth daily with breakfast. Patient not taking: Reported on 10/22/2018 07/19/18   Myles Lipps, MD    Past Medical History:  Diagnosis Date  . Asthma   . PCOS (polycystic ovarian syndrome)     Past Surgical History:  Procedure Laterality Date  . EYE SURGERY      Social History   Tobacco Use  . Smoking status: Never Smoker  . Smokeless tobacco: Never Used  Substance Use Topics  . Alcohol use: No    Frequency: Never    Family History  Problem Relation Age  of Onset  . Diabetes Mother   . Asthma Father   . Hypertension Father   . Asthma Brother   . Cancer Maternal Uncle   . Cancer Maternal Grandmother   . Diabetes Paternal Grandmother     ROS Per hpi  OBJECTIVE:  Today's Vitals   10/22/18 1358  BP: 120/84  Pulse: 92  Temp: 98.6 F (37 C)  TempSrc: Oral  SpO2: 98%   There is no height or weight on file to calculate BMI.   Physical Exam Vitals signs and nursing note reviewed.  Constitutional:      Appearance: She is well-developed.  HENT:     Head: Normocephalic and atraumatic.  Eyes:     General: No scleral icterus.    Conjunctiva/sclera: Conjunctivae normal.     Pupils: Pupils are equal, round, and reactive to light.  Neck:     Musculoskeletal: Neck supple.  Pulmonary:     Effort: Pulmonary effort is normal.  Skin:    General: Skin is warm and dry.  Neurological:     Mental Status: She is alert and oriented to person, place, and time.    ASSESSMENT and PLAN  1. Irregular periods Workup suggestive of PCOS, did  not tolerate metformin, discussed importance of weight loss, referring to oby gyn for further eval and treatment regarding fertility. - Ambulatory referral to Obstetrics / Gynecology  Return if symptoms worsen or fail to improve.    Myles LippsIrma M Santiago, MD Primary Care at [facility] [address] [place], [place] [address] Ph.  [phone] Fax [phone]

## 2019-09-06 ENCOUNTER — Other Ambulatory Visit: Payer: Self-pay

## 2019-09-06 ENCOUNTER — Encounter: Payer: Self-pay | Admitting: Podiatry

## 2019-09-06 ENCOUNTER — Ambulatory Visit: Payer: Managed Care, Other (non HMO) | Admitting: Podiatry

## 2019-09-06 DIAGNOSIS — M79674 Pain in right toe(s): Secondary | ICD-10-CM | POA: Diagnosis not present

## 2019-09-06 DIAGNOSIS — M79675 Pain in left toe(s): Secondary | ICD-10-CM

## 2019-09-06 DIAGNOSIS — L600 Ingrowing nail: Secondary | ICD-10-CM | POA: Diagnosis not present

## 2019-09-06 MED ORDER — CEPHALEXIN 500 MG PO CAPS: 500.0000 mg | ORAL_CAPSULE | Freq: Three times a day (TID) | ORAL | 0 refills | Status: DC

## 2019-09-06 NOTE — Patient Instructions (Signed)

## 2019-09-20 NOTE — Progress Notes (Signed)
Subjective:   Patient ID: Danielle Padilla, female   DOB: 24 y.o.   MRN: [id number]   HPI 24 year old female presents the office today for concerns of ingrown toenails both of her big toenails to the lateral aspect worsened on past 3 months.  She does describe bloody as well as pus coming from the nails at times.  Area is painful with pressure in shoes.  She has no concerns today.   Review of Systems  All other systems reviewed and are negative.  Past Medical History:  Diagnosis Date  . Asthma   . PCOS (polycystic ovarian syndrome)     Past Surgical History:  Procedure Laterality Date  . EYE SURGERY       Current Outpatient Medications:  .  cephALEXin (KEFLEX) 500 MG capsule, Take 1 capsule (500 mg total) by mouth 3 (three) times daily., Disp: 30 capsule, Rfl: 0  No Known Allergies       Objective:  Physical Exam  General: AAO x3, NAD  Dermatological: Incurvation present to lateral aspects of bilateral hallux toenail with localized edema and erythema.  There is no ascending cellulitis there is no drainage or pus identified today.  No open lesions otherwise.  Vascular: Dorsalis Pedis artery and Posterior Tibial artery pedal pulses are 2/4 bilateral with immedate capillary fill time.There is no pain with calf compression, swelling, warmth, erythema.   Neruologic: Grossly intact via light touch bilateral.   Musculoskeletal: No gross boney pedal deformities bilateral. No pain, crepitus, or limitation noted with foot and ankle range of motion bilateral. Muscular strength 5/5 in all groups tested bilateral.  Gait: Unassisted, Nonantalgic.       Assessment:   Ingrown toenails bilateral lateral hallux nail fold     Plan:  -Treatment options discussed including all alternatives, risks, and complications -Etiology of symptoms were discussed -At this time, the patient is requesting partial nail removal with chemical matricectomy to the symptomatic portion of the  nail. Risks and complications were discussed with the patient for which they understand and written consent was obtained. Under sterile conditions a total of 3 mL of a mixture of 2% lidocaine plain and 0.5% Marcaine plain was infiltrated in a hallux block fashion. Once anesthetized, the skin was prepped in sterile fashion. A tourniquet was then applied. Next the lateral aspect of hallux nail border was then sharply excised making sure to remove the entire offending nail border. Once the nails were ensured to be removed area was debrided and the underlying skin was intact. There is no purulence identified in the procedure. Next phenol was then applied under standard conditions and copiously irrigated. Silvadene was applied. A dry sterile dressing was applied. After application of the dressing the tourniquet was removed and there is found to be an immediate capillary refill time to the digit. The patient tolerated the procedure well any complications. Post procedure instructions were discussed the patient for which he verbally understood. Follow-up in one week for nail check or sooner if any problems are to arise. Discussed signs/symptoms of infection and directed to call the office immediately should any occur or go directly to the emergency room. In the meantime, encouraged to call the office with any questions, concerns, changes symptoms. -Keflex  Return for nail check-right and left big toes.  Vivi Barrack DPM

## 2019-09-22 ENCOUNTER — Ambulatory Visit: Payer: Managed Care, Other (non HMO) | Admitting: Podiatry

## 2019-09-22 ENCOUNTER — Other Ambulatory Visit: Payer: Self-pay

## 2019-09-22 ENCOUNTER — Telehealth: Payer: Self-pay

## 2019-09-22 DIAGNOSIS — L600 Ingrowing nail: Secondary | ICD-10-CM | POA: Insufficient documentation

## 2019-09-22 NOTE — Telephone Encounter (Signed)
Scheduled appt for patient

## 2019-09-22 NOTE — Progress Notes (Signed)
Subjective: Danielle Padilla is a 24 y.o.  female returns to office today for follow up evaluation after having bilateral Hallux lateralnail avulsion performed. Patient has been soaking using epsom salts and applying topical antibiotic covered with bandaid daily. Patient denies fevers, chills, nausea, vomiting. Denies any calf pain, chest pain, SOB.   Objective:  Vitals: Reviewed  General: Well developed, nourished, in no acute distress, alert and oriented x3   Dermatology: Skin is warm, dry and supple bilateral. LEFT and RIGHT hallux nail border appears to be clean, dry, with mild granular tissue and surrounding scab. There is no surrounding erythema, edema, drainage/purulence. The remaining nails appear unremarkable at this time. There are no other lesions or other signs of infection present.  Neurovascular status: Intact. No lower extremity swelling; No pain with calf compression bilateral.  Musculoskeletal: No tenderness to palpation of the lateral hallux nail folds. Muscular strength within normal limits bilateral.   Assesement and Plan: S/p partial nail avulsion, doing well.   -Continue soaking in epsom salts twice a day followed by antibiotic ointment and a band-aid. Can leave uncovered at night. Continue this until completely healed.  -If the area has not healed in 2 weeks, call the office for follow-up appointment, or sooner if any problems arise.  -Monitor for any signs/symptoms of infection. Call the office immediately if any occur or go directly to the emergency room. Call with any questions/concerns.  Ovid Curd, DPM

## 2019-09-22 NOTE — Patient Instructions (Signed)

## 2019-10-13 ENCOUNTER — Encounter: Payer: Self-pay | Admitting: Family Medicine

## 2019-10-13 ENCOUNTER — Other Ambulatory Visit: Payer: Self-pay

## 2019-10-13 ENCOUNTER — Ambulatory Visit: Payer: Managed Care, Other (non HMO) | Admitting: Family Medicine

## 2019-10-13 VITALS — BP 118/83 | HR 75 | Temp 98.2°F | Ht 68.0 in | Wt 265.0 lb

## 2019-10-13 DIAGNOSIS — Z131 Encounter for screening for diabetes mellitus: Secondary | ICD-10-CM

## 2019-10-13 DIAGNOSIS — S00431A Contusion of right ear, initial encounter: Secondary | ICD-10-CM

## 2019-10-13 NOTE — Patient Instructions (Signed)
° ° ° °  If you have lab work done today you will be contacted with your lab results within the next 2 weeks.  If you have not heard from us then please contact us. The fastest way to get your results is to register for My Chart. ° ° °IF you received an x-ray today, you will receive an invoice from [facility]. Please contact [facility] at [phone] with questions or concerns regarding your invoice.  ° °IF you received labwork today, you will receive an invoice from [facility]. Please contact [facility] at [phone] with questions or concerns regarding your invoice.  ° °Our billing staff will not be able to assist you with questions regarding bills from these companies. ° °You will be contacted with the lab results as soon as they are available. The fastest way to get your results is to activate your My Chart account. Instructions are located on the last page of this paperwork. If you have not heard from us regarding the results in 2 weeks, please contact this office. °  ° ° ° °

## 2019-10-13 NOTE — Progress Notes (Signed)
   5/27/20213:20 PM  Danielle Padilla 1995/10/12, 24 y.o., female [id number]  Chief Complaint  Patient presents with  . Scar    back of right ear, started growing 3 months ago    HPI:   Patient is a 24 y.o. female who presents today for scar right ear  Patient removed piercing 2-3 months ago and since then scar developed, not growing anymore, pulsatile and is painful, no bleeding or drainage  Depression screen [facility] 2/9 10/13/2019 10/22/2018 07/19/2018  Decreased Interest 0 0 0  Down, Depressed, Hopeless 0 0 0  PHQ - 2 Score 0 0 0  Altered sleeping - - -  Tired, decreased energy - - -  Change in appetite - - -  Feeling bad or failure about yourself  - - -  Trouble concentrating - - -  Moving slowly or fidgety/restless - - -  Suicidal thoughts - - -  PHQ-9 Score - - -  Difficult doing work/chores - - -    Fall Risk  10/22/2018 07/19/2018 06/21/2018  Falls in the past year? 0 0 0  Number falls in past yr: 0 0 0  Injury with Fall? 0 - 0     No Known Allergies  Prior to Admission medications   Not on File    Past Medical History:  Diagnosis Date  . Asthma   . PCOS (polycystic ovarian syndrome)     Past Surgical History:  Procedure Laterality Date  . EYE SURGERY      Social History   Tobacco Use  . Smoking status: Never Smoker  . Smokeless tobacco: Never Used  Substance Use Topics  . Alcohol use: No    Family History  Problem Relation Age of Onset  . Diabetes Mother   . Asthma Father   . Hypertension Father   . Asthma Brother   . Cancer Maternal Uncle   . Cancer Maternal Grandmother   . Diabetes Paternal Grandmother     ROS Per hpi  OBJECTIVE:  Today's Vitals   10/13/19 1516  BP: 118/83  Pulse: 75  Temp: 98.2 F (36.8 C)  SpO2: 100%  Weight: 265 lb (120.2 kg)  Height: 5\' 8"  (1.727 m)   Body mass index is 40.29 kg/m.   Physical Exam   Gen: AAOx3, NAD Right ear lobe with round indurated violaceous nodule, hematoma  No results found  for this or any previous visit (from the past 24 hour(s)).  No results found.   ASSESSMENT and PLAN  1. Hematoma of right pinna, initial encounter - Ambulatory referral to Dermatology  2. Screening for diabetes mellitus (DM) - Hemoglobin A1c  Return for to discuss irregular periods.    , MD Primary Care at [facility] [address] [place], [place] Ph.  [phone] Fax [phone]

## 2019-10-14 LAB — HEMOGLOBIN A1C
Est. average glucose Bld gHb Est-mCnc: 105 mg/dL
Hgb A1c MFr Bld: 5.3 % (ref 4.8–5.6)

## 2019-10-20 ENCOUNTER — Telehealth: Payer: Self-pay | Admitting: Family Medicine

## 2019-10-24 ENCOUNTER — Ambulatory Visit: Payer: Managed Care, Other (non HMO) | Admitting: Family Medicine

## 2020-01-19 ENCOUNTER — Ambulatory Visit: Payer: No Typology Code available for payment source | Admitting: Physician Assistant

## 2020-02-02 ENCOUNTER — Emergency Department (HOSPITAL_COMMUNITY)
Admission: EM | Admit: 2020-02-02 | Discharge: 2020-02-03 | Disposition: A | Discharge status: Home / Self Care | Payer: Managed Care, Other (non HMO) | Attending: Emergency Medicine | Admitting: Emergency Medicine

## 2020-02-02 ENCOUNTER — Encounter (HOSPITAL_COMMUNITY): Payer: Self-pay

## 2020-02-02 ENCOUNTER — Encounter: Payer: Self-pay | Admitting: Family Medicine

## 2020-02-02 ENCOUNTER — Emergency Department (INDEPENDENT_AMBULATORY_CARE_PROVIDER_SITE_OTHER): Payer: Managed Care, Other (non HMO)

## 2020-02-02 ENCOUNTER — Other Ambulatory Visit: Payer: Self-pay

## 2020-02-02 ENCOUNTER — Ambulatory Visit (INDEPENDENT_AMBULATORY_CARE_PROVIDER_SITE_OTHER): Payer: Managed Care, Other (non HMO) | Admitting: Family Medicine

## 2020-02-02 VITALS — BP 136/83 | HR 88 | Temp 98.0°F | Ht 68.0 in | Wt 269.2 lb

## 2020-02-02 DIAGNOSIS — K219 Gastro-esophageal reflux disease without esophagitis: Secondary | ICD-10-CM

## 2020-02-02 DIAGNOSIS — R109 Unspecified abdominal pain: Secondary | ICD-10-CM | POA: Insufficient documentation

## 2020-02-02 DIAGNOSIS — Z5321 Procedure and treatment not carried out due to patient leaving prior to being seen by health care provider: Secondary | ICD-10-CM | POA: Diagnosis not present

## 2020-02-02 DIAGNOSIS — M79604 Pain in right leg: Secondary | ICD-10-CM | POA: Diagnosis not present

## 2020-02-02 DIAGNOSIS — R112 Nausea with vomiting, unspecified: Secondary | ICD-10-CM | POA: Diagnosis not present

## 2020-02-02 LAB — POCT URINALYSIS DIP (MANUAL ENTRY)
Bilirubin, UA: NEGATIVE
Glucose, UA: NEGATIVE mg/dL
Ketones, POC UA: NEGATIVE mg/dL
Leukocytes, UA: NEGATIVE
Nitrite, UA: NEGATIVE
Protein Ur, POC: NEGATIVE mg/dL
Spec Grav, UA: 1.02 (ref 1.010–1.025)
Urobilinogen, UA: 0.2 E.U./dL
pH, UA: 7 (ref 5.0–8.0)

## 2020-02-02 LAB — URINALYSIS, ROUTINE W REFLEX MICROSCOPIC
Bilirubin Urine: NEGATIVE
Glucose, UA: NEGATIVE mg/dL
Hgb urine dipstick: NEGATIVE
Ketones, ur: NEGATIVE mg/dL
Nitrite: NEGATIVE
Protein, ur: NEGATIVE mg/dL
Specific Gravity, Urine: >1.03 — ABNORMAL HIGH (ref 1.005–1.030)
pH: 5.5 (ref 5.0–8.0)

## 2020-02-02 LAB — POCT CBC
Granulocyte percent: 77.2 %G (ref 37–80)
HCT, POC: 40.8 % (ref 29–41)
Hemoglobin: 13.7 g/dL (ref 11–14.6)
Lymph, poc: 1.6 (ref 0.6–3.4)
MCH, POC: 30.6 pg (ref 27–31.2)
MCHC: 33.5 g/dL (ref 31.8–35.4)
MCV: 97.4 fL (ref 76–111)
MID (cbc): 0.6 (ref 0–0.9)
MPV: 6.6 fL (ref 0–99.8)
POC Granulocyte: 7.6 — AB (ref 2–6.9)
POC LYMPH PERCENT: 16.6 %L (ref 10–50)
POC MID %: 6.2 %M (ref 0–12)
Platelet Count, POC: 355 10*3/uL (ref 142–424)
RBC: 4.46 M/uL (ref 4.04–5.48)
RDW, POC: 12.2 %
WBC: 9.8 10*3/uL (ref 4.6–10.2)

## 2020-02-02 LAB — COMPREHENSIVE METABOLIC PANEL
ALT: 60 IU/L — ABNORMAL HIGH (ref 0–32)
AST: 32 IU/L (ref 0–40)
Albumin/Globulin Ratio: 1.9 (ref 1.2–2.2)
Albumin: 5 g/dL (ref 3.9–5.0)
Alkaline Phosphatase: 79 IU/L (ref 44–121)
BUN/Creatinine Ratio: 9 (ref 9–23)
BUN: 11 mg/dL (ref 6–20)
Bilirubin Total: 0.6 mg/dL (ref 0.0–1.2)
CO2: 24 mmol/L (ref 20–29)
Calcium: 10.2 mg/dL (ref 8.7–10.2)
Chloride: 101 mmol/L (ref 96–106)
Creatinine, Ser: 1.23 mg/dL — ABNORMAL HIGH (ref 0.57–1.00)
GFR calc Af Amer: 71 mL/min/{1.73_m2} (ref >59)
GFR calc non Af Amer: 62 mL/min/{1.73_m2} (ref >59)
Globulin, Total: 2.6 g/dL (ref 1.5–4.5)
Glucose: 105 mg/dL — ABNORMAL HIGH (ref 65–99)
Potassium: 4.8 mmol/L (ref 3.5–5.2)
Sodium: 139 mmol/L (ref 134–144)
Total Protein: 7.6 g/dL (ref 6.0–8.5)

## 2020-02-02 LAB — URINALYSIS, MICROSCOPIC (REFLEX): RBC / HPF: NONE SEEN RBC/hpf (ref 0–5)

## 2020-02-02 LAB — POCT URINE PREGNANCY: Preg Test, Ur: NEGATIVE

## 2020-02-02 MED ORDER — ONDANSETRON HCL 4 MG/2ML IJ SOLN
4.0000 mg | Freq: Once | INTRAMUSCULAR | Status: AC
  Administered 2020-02-02: 4 mg via INTRAMUSCULAR

## 2020-02-02 MED ORDER — ONDANSETRON HCL 4 MG PO TABS: 4.0000 mg | ORAL_TABLET | Freq: Three times a day (TID) | ORAL | 0 refills | Status: DC | PRN

## 2020-02-02 MED ORDER — PROMETHAZINE HCL 25 MG/ML IJ SOLN: 25.0000 mg | Freq: Once | INTRAMUSCULAR | Status: DC

## 2020-02-02 MED ORDER — OMEPRAZOLE 20 MG PO CPDR: 20.0000 mg | DELAYED_RELEASE_CAPSULE | Freq: Two times a day (BID) | ORAL | 3 refills | Status: DC

## 2020-02-02 MED ORDER — KETOROLAC TROMETHAMINE 60 MG/2ML IM SOLN
60.0000 mg | Freq: Once | INTRAMUSCULAR | Status: AC
  Administered 2020-02-02: 60 mg via INTRAMUSCULAR

## 2020-02-02 NOTE — ED Notes (Signed)
Pt leaving to go to [facility]. Stated her ride is out front and she been here to long.

## 2020-02-02 NOTE — ED Triage Notes (Signed)
Pt reports R sided flank pain that radaites down her R leg, denies urinary symptoms, symptoms started at 1am

## 2020-02-02 NOTE — Patient Instructions (Signed)
° ° ° °  If you have lab work done today you will be contacted with your lab results within the next 2 weeks.  If you have not heard from us then please contact us. The fastest way to get your results is to register for My Chart. ° ° °IF you received an x-ray today, you will receive an invoice from [facility]. Please contact [facility] at [phone] with questions or concerns regarding your invoice.  ° °IF you received labwork today, you will receive an invoice from [facility]. Please contact [facility] at [phone] with questions or concerns regarding your invoice.  ° °Our billing staff will not be able to assist you with questions regarding bills from these companies. ° °You will be contacted with the lab results as soon as they are available. The fastest way to get your results is to activate your My Chart account. Instructions are located on the last page of this paperwork. If you have not heard from us regarding the results in 2 weeks, please contact this office. °  ° ° ° °

## 2020-02-02 NOTE — Progress Notes (Signed)
9/16/202111:35 AM  Danielle Padilla 1995/11/07, 24 y.o., female [id number]  Chief Complaint  Patient presents with  . Pain    pain on right side stomach and back. Went to er this morning, not seen. Also wants hcg testing. Taking no meds for pain    HPI:   Patient is a 24 y.o. female with past medical history significant for PCOS who presents today for right side abd/back pain  Woke up this morning with right flank pain that radiated towards lower abd Increased urinary frequency, no dysuria or hematuria Increased pelvic pressure Nausea with vomiting today x 4, no blood or coffee ground No fever but has had chills No constipation or diarrhea but recently having diarrhea after eating, increased reflux No blood in stool, no black tarry stool No cough or SOB Has not eaten today Pain is constant, now a bit less intense, 7-8/10 Nothing makes it better or worse No h/o kidney stones LMP Jan 13 2020 Here with partner    Depression screen [facility] 2/9 10/13/2019 10/22/2018 07/19/2018  Decreased Interest 0 0 0  Down, Depressed, Hopeless 0 0 0  PHQ - 2 Score 0 0 0  Altered sleeping - - -  Tired, decreased energy - - -  Change in appetite - - -  Feeling bad or failure about yourself  - - -  Trouble concentrating - - -  Moving slowly or fidgety/restless - - -  Suicidal thoughts - - -  PHQ-9 Score - - -  Difficult doing work/chores - - -    Fall Risk  10/22/2018 07/19/2018 06/21/2018  Falls in the past year? 0 0 0  Number falls in past yr: 0 0 0  Injury with Fall? 0 - 0     No Known Allergies  Prior to Admission medications   Not on File    Past Medical History:  Diagnosis Date  . Asthma   . PCOS (polycystic ovarian syndrome)     Past Surgical History:  Procedure Laterality Date  . EYE SURGERY      Social History   Tobacco Use  . Smoking status: Never Smoker  . Smokeless tobacco: Never Used  Substance Use Topics  . Alcohol use: No    Family History  Problem  Relation Age of Onset  . Diabetes Mother   . Asthma Father   . Hypertension Father   . Asthma Brother   . Cancer Maternal Uncle   . Cancer Maternal Grandmother   . Diabetes Paternal Grandmother     ROS Per hpi  OBJECTIVE:  Today's Vitals   02/02/20 1121  BP: 136/83  Pulse: 88  Temp: 98 F (36.7 C)  SpO2: 100%  Weight: 269 lb 3.2 oz (122.1 kg)  Height: 5\' 8"  (1.727 m)   Body mass index is 40.93 kg/m.   Physical Exam Vitals and nursing note reviewed.  Constitutional:      Appearance: She is well-developed.  HENT:     Head: Normocephalic and atraumatic.     Mouth/Throat:     Pharynx: No oropharyngeal exudate.  Eyes:     General: No scleral icterus.    Extraocular Movements: Extraocular movements intact.     Conjunctiva/sclera: Conjunctivae normal.     Pupils: Pupils are equal, round, and reactive to light.  Cardiovascular:     Rate and Rhythm: Normal rate and regular rhythm.     Heart sounds: Normal heart sounds. No murmur heard.  No friction rub. No gallop.   Pulmonary:  Effort: Pulmonary effort is normal.     Breath sounds: Normal breath sounds. No wheezing, rhonchi or rales.  Abdominal:     General: Bowel sounds are normal. There is no distension.     Palpations: Abdomen is soft. There is no hepatomegaly, splenomegaly or mass.     Tenderness: There is abdominal tenderness in the right upper quadrant and epigastric area. There is right CVA tenderness. There is no left CVA tenderness. Negative signs include Murphy's sign.  Musculoskeletal:     Cervical back: Neck supple.  Skin:    General: Skin is warm and dry.  Neurological:     Mental Status: She is alert and oriented to person, place, and time.     Results for orders placed or performed during the hospital encounter of 02/02/20 (from the past 24 hour(s))  Urinalysis, Routine w reflex microscopic Urine, Clean Catch     Status: Abnormal   Collection Time: 02/02/20  3:49 AM  Result Value Ref Range    Color, Urine YELLOW YELLOW   APPearance CLOUDY (A) CLEAR   Specific Gravity, Urine >1.030 (H) 1.005 - 1.030   pH 5.5 5.0 - 8.0   Glucose, UA NEGATIVE NEGATIVE mg/dL   Hgb urine dipstick NEGATIVE NEGATIVE   Bilirubin Urine NEGATIVE NEGATIVE   Ketones, ur NEGATIVE NEGATIVE mg/dL   Protein, ur NEGATIVE NEGATIVE mg/dL   Nitrite NEGATIVE NEGATIVE   Leukocytes,Ua SMALL (A) NEGATIVE  Urinalysis, Microscopic (reflex)     Status: Abnormal   Collection Time: 02/02/20  3:49 AM  Result Value Ref Range   RBC / HPF NONE SEEN 0 - 5 RBC/hpf   WBC, UA 0-5 0 - 5 WBC/hpf   Bacteria, UA RARE (A) NONE SEEN   Squamous Epithelial / LPF 11-20 0 - 5    DG Abd 2 Views  Result Date: 02/02/2020 CLINICAL DATA:  Right flank pain EXAM: X-RAY ABDOMEN 2 VIEWS COMPARISON:  July 14, 2012 FINDINGS: Supine and upright images were obtained. There is moderate stool throughout the colon. There is no bowel dilatation or air-fluid level to suggest bowel obstruction. No evident free air. Probable small phleboliths noted in the pelvis, not appreciably changed from prior study. Lung bases clear. IMPRESSION: Moderate stool in colon. No bowel obstruction or free air. Lung bases clear. Electronically Signed   By: Bretta Bang III M.D.   On: 02/02/2020 13:48     ASSESSMENT and PLAN  1. Acute right flank pain - POCT urine pregnancy - neg - POCT urinalysis dipstick - no infection nor blood - DG Abd 2 Views - no renal stone seen - POCT CBC - normal WBC, no anemia - ketorolac (TORADOL) injection 60 mg - pain resolved with toradol  Patient feeling sign better. Etiology still unclear. ? Passed stone? Given resolution of sx will monitor clinically. RTC precautions reviewed.   2. Nausea and vomiting, intractability of vomiting not specified, unspecified vomiting type - ondansetron (ZOFRAN) injection 4 mg - resolved after zofran. Oral rx given for prn use.  3. Gastroesophageal reflux disease without esophagitis Start PPI -  Comprehensive metabolic panel  Discussed constipation treatment: increase fiber and water intake  Other orders - omeprazole (PRILOSEC) 20 MG capsule; Take 1 capsule (20 mg total) by mouth 2 (two) times daily before a meal. - ondansetron (ZOFRAN) 4 MG tablet; Take 1 tablet (4 mg total) by mouth every 8 (eight) hours as needed for nausea or vomiting.  No follow-ups on file.    Myles Lipps, MD [facility] at [address], [place] [address] Ph.  [phone] Fax [phone]

## 2020-02-06 ENCOUNTER — Encounter: Payer: Self-pay | Admitting: Family Medicine

## 2020-06-13 NOTE — Telephone Encounter (Signed)
error 

## 2021-06-24 ENCOUNTER — Other Ambulatory Visit: Payer: Self-pay | Admitting: Family Medicine

## 2021-06-24 DIAGNOSIS — N6320 Unspecified lump in the left breast, unspecified quadrant: Secondary | ICD-10-CM

## 2021-07-26 ENCOUNTER — Other Ambulatory Visit: Payer: Self-pay

## 2021-07-26 ENCOUNTER — Ambulatory Visit
Admission: RE | Admit: 2021-07-26 | Discharge: 2021-07-26 | Disposition: A | Discharge status: Home / Self Care | Payer: Managed Care, Other (non HMO) | Source: Ambulatory Visit | Attending: Family Medicine | Admitting: Family Medicine

## 2021-07-26 DIAGNOSIS — N6320 Unspecified lump in the left breast, unspecified quadrant: Secondary | ICD-10-CM

## 2021-08-06 ENCOUNTER — Ambulatory Visit
Admit: 2021-08-06 | Discharge status: Against Medical Advice | Payer: Managed Care, Other (non HMO) | Source: Home / Self Care

## 2021-08-13 ENCOUNTER — Other Ambulatory Visit: Payer: Self-pay

## 2021-08-13 ENCOUNTER — Ambulatory Visit
Admission: EM | Admit: 2021-08-13 | Discharge: 2021-08-13 | Disposition: A | Discharge status: Home / Self Care | Payer: Managed Care, Other (non HMO)

## 2021-08-13 DIAGNOSIS — J020 Streptococcal pharyngitis: Secondary | ICD-10-CM

## 2021-08-13 LAB — POCT RAPID STREP A (OFFICE): Rapid Strep A Screen: POSITIVE — AB

## 2021-08-13 MED ORDER — AMOXICILLIN 500 MG PO CAPS: 500.0000 mg | ORAL_CAPSULE | Freq: Three times a day (TID) | ORAL | 0 refills | Status: DC

## 2021-08-13 NOTE — ED Triage Notes (Signed)
5 day h/o sore throat with dysphagia. Pt reports that her sxs appeared to resolve for a day or two before returning yesterday with a new onset of cough and congestion. No v/d. ?

## 2021-08-13 NOTE — ED Provider Notes (Signed)
?[facility] ? ? ? ?CSN: [id number] ?Arrival date & time: 08/13/21  2482 ? ? ?  ? ?History   ?Chief Complaint ?Chief Complaint  ?Patient presents with  ? Sore Throat  ? ? ?HPI ?Danielle Padilla is a 26 y.o. female.  ? ?Patient here today for evaluation of sore throat and difficulty swallowing that she has had for the last 5 days.  She reports that she did have some improvement but then symptoms returned yesterday.  She states she has had some cough and congestion as well.  She has not had any vomiting or diarrhea.  She has tried taking over-the-counter cold and cough medicine with mild relief. ? ?The history is provided by the patient.  ? ?Past Medical History:  ?Diagnosis Date  ? Asthma   ? PCOS (polycystic ovarian syndrome)   ? ? ?Patient Active Problem List  ? Diagnosis Date Noted  ? Ingrown toenail 09/22/2019  ? PCOS (polycystic ovarian syndrome) 07/19/2018  ? ? ?Past Surgical History:  ?Procedure Laterality Date  ? EYE SURGERY    ? ? ?OB History   ? ? Gravida  ?0  ? Para  ?0  ? Term  ?0  ? Preterm  ?0  ? AB  ?0  ? Living  ?0  ?  ? ? SAB  ?0  ? IAB  ?0  ? Ectopic  ?0  ? Multiple  ?0  ? Live Births  ?0  ?   ?  ?  ? ? ? ?Home Medications   ? ?Prior to Admission medications   ?Medication Sig Start Date End Date Taking? Authorizing Provider  ?amoxicillin (AMOXIL) 500 MG capsule Take 1 capsule (500 mg total) by mouth 3 (three) times daily. 08/13/21  Yes Tomi Bamberger, PA-C  ?metFORMIN (GLUCOPHAGE-XR) 500 MG 24 hr tablet 1 tablet with evening meal for 3-4 weeks, then take 2 tablets with evening meals 05/16/21  Yes [provider]  ?omeprazole (PRILOSEC) 20 MG capsule Take 1 capsule (20 mg total) by mouth 2 (two) times daily before a meal. 02/02/20   Lezlie Lye, Meda Coffee, MD  ?ondansetron (ZOFRAN) 4 MG tablet Take 1 tablet (4 mg total) by mouth every 8 (eight) hours as needed for nausea or vomiting. 02/02/20   Lezlie Lye, Meda Coffee, MD  ? ? ?Family History ?Family History  ?Problem  Relation Age of Onset  ? Diabetes Mother   ? Asthma Father   ? Hypertension Father   ? Asthma Brother   ? Cancer Maternal Uncle   ? Cancer Maternal Grandmother   ? Diabetes Paternal Grandmother   ? ? ?Social History ?Social History  ? ?Tobacco Use  ? Smoking status: Never  ? Smokeless tobacco: Never  ?Vaping Use  ? Vaping Use: Never used  ?Substance Use Topics  ? Alcohol use: No  ? Drug use: No  ? ? ? ?Allergies   ?Patient has no known allergies. ? ? ?Review of Systems ?Review of Systems  ?Constitutional:  Negative for chills and fever.  ?HENT:  Positive for congestion and sore throat. Negative for ear pain.   ?Eyes:  Negative for discharge and redness.  ?Respiratory:  Positive for cough. Negative for shortness of breath and wheezing.   ?Gastrointestinal:  Negative for abdominal pain, diarrhea, nausea and vomiting.  ? ? ?Physical Exam ?Triage Vital Signs ?ED Triage Vitals  ?Enc Vitals Group  ?   BP   ?   Pulse   ?   Resp   ?  Temp   ?   Temp src   ?   SpO2   ?   Weight   ?   Height   ?   Head Circumference   ?   Peak Flow   ?   Pain Score   ?   Pain Loc   ?   Pain Edu?   ?   Excl. in GC?   ? ?No data found. ? ?Updated Vital Signs ?BP (!) 144/89 (BP Location: Left Arm)   Pulse 78   Temp 98.2 ?F (36.8 ?C) (Oral)   Resp 17   SpO2 96%  ?   ? ?Physical Exam ?Vitals and nursing note reviewed.  ?Constitutional:   ?   General: She is not in acute distress. ?   Appearance: Normal appearance. She is not ill-appearing.  ?HENT:  ?   Head: Normocephalic and atraumatic.  ?   Nose: Congestion present.  ?   Mouth/Throat:  ?   Mouth: Mucous membranes are moist.  ?   Pharynx: Oropharyngeal exudate and posterior oropharyngeal erythema present.  ?Eyes:  ?   Conjunctiva/sclera: Conjunctivae normal.  ?Cardiovascular:  ?   Rate and Rhythm: Normal rate.  ?Pulmonary:  ?   Effort: Pulmonary effort is normal. No respiratory distress.  ?Skin: ?   General: Skin is warm and dry.  ?Neurological:  ?   Mental Status: She is alert.   ?Psychiatric:     ?   Mood and Affect: Mood normal.     ?   Thought Content: Thought content normal.  ? ? ? ?UC Treatments / Results  ?Labs ?(all labs ordered are listed, but only abnormal results are displayed) ?Labs Reviewed  ?POCT RAPID STREP A (OFFICE) - Abnormal; Notable for the following components:  ?    Result Value  ? Rapid Strep A Screen Positive (*)   ? All other components within normal limits  ? ? ?EKG ? ? ?Radiology ?No results found. ? ?Procedures ?Procedures (including critical care time) ? ?Medications Ordered in UC ?Medications - No data to display ? ?Initial Impression / Assessment and Plan / UC Course  ?I have reviewed the triage vital signs and the nursing notes. ? ?Pertinent labs & imaging results that were available during my care of the patient were reviewed by me and considered in my medical decision making (see chart for details). ? ?  ?Rapid strep screening positive.  Will treat with amoxicillin and encouraged symptomatic treatment as needed.  Encouraged follow-up with any further concerns. ? ?Final Clinical Impressions(s) / UC Diagnoses  ? ?Final diagnoses:  ?Streptococcal sore throat  ? ?Discharge Instructions   ?None ?  ? ?ED Prescriptions   ? ? Medication Sig Dispense Auth. Provider  ? amoxicillin (AMOXIL) 500 MG capsule Take 1 capsule (500 mg total) by mouth 3 (three) times daily. 21 capsule Tomi Bamberger, PA-C  ? ?  ? ?PDMP not reviewed this encounter. ?  ?Tomi Bamberger, PA-C ?08/13/21 4158 ? ?

## 2021-11-15 LAB — OB RESULTS CONSOLE RPR: RPR: NONREACTIVE

## 2021-11-15 LAB — OB RESULTS CONSOLE HEPATITIS B SURFACE ANTIGEN: Hepatitis B Surface Ag: NEGATIVE

## 2021-11-15 LAB — HEPATITIS C ANTIBODY: HCV Ab: NEGATIVE

## 2021-11-15 LAB — OB RESULTS CONSOLE HIV ANTIBODY (ROUTINE TESTING): HIV: NONREACTIVE

## 2021-11-15 LAB — OB RESULTS CONSOLE RUBELLA ANTIBODY, IGM: Rubella: IMMUNE

## 2021-12-06 LAB — OB RESULTS CONSOLE GC/CHLAMYDIA
Chlamydia: NEGATIVE
Neisseria Gonorrhea: NEGATIVE

## 2021-12-23 ENCOUNTER — Encounter: Payer: Managed Care, Other (non HMO) | Admitting: Obstetrics and Gynecology

## 2022-05-15 ENCOUNTER — Encounter (HOSPITAL_COMMUNITY): Payer: Self-pay

## 2022-05-15 ENCOUNTER — Other Ambulatory Visit: Payer: Self-pay

## 2022-05-15 ENCOUNTER — Inpatient Hospital Stay (HOSPITAL_COMMUNITY)
Admission: AD | Admit: 2022-05-15 | Discharge: 2022-05-15 | Disposition: A | Discharge status: Home / Self Care | Payer: Managed Care, Other (non HMO) | Attending: Obstetrics and Gynecology | Admitting: Obstetrics and Gynecology

## 2022-05-15 DIAGNOSIS — O26893 Other specified pregnancy related conditions, third trimester: Secondary | ICD-10-CM | POA: Insufficient documentation

## 2022-05-15 DIAGNOSIS — R030 Elevated blood-pressure reading, without diagnosis of hypertension: Secondary | ICD-10-CM | POA: Diagnosis not present

## 2022-05-15 DIAGNOSIS — O163 Unspecified maternal hypertension, third trimester: Secondary | ICD-10-CM

## 2022-05-15 DIAGNOSIS — Z3A33 33 weeks gestation of pregnancy: Secondary | ICD-10-CM | POA: Insufficient documentation

## 2022-05-15 LAB — COMPREHENSIVE METABOLIC PANEL
ALT: 26 U/L (ref 0–44)
AST: 26 U/L (ref 15–41)
Albumin: 3.2 g/dL — ABNORMAL LOW (ref 3.5–5.0)
Alkaline Phosphatase: 89 U/L (ref 38–126)
Anion gap: 9 (ref 5–15)
BUN: 8 mg/dL (ref 6–20)
CO2: 22 mmol/L (ref 22–32)
Calcium: 9.9 mg/dL (ref 8.9–10.3)
Chloride: 107 mmol/L (ref 98–111)
Creatinine, Ser: 0.79 mg/dL (ref 0.44–1.00)
GFR, Estimated: >60 mL/min (ref >60)
Glucose, Bld: 84 mg/dL (ref 70–99)
Potassium: 4.1 mmol/L (ref 3.5–5.1)
Sodium: 138 mmol/L (ref 135–145)
Total Bilirubin: 0.5 mg/dL (ref 0.3–1.2)
Total Protein: 6.6 g/dL (ref 6.5–8.1)

## 2022-05-15 LAB — PROTEIN / CREATININE RATIO, URINE
Creatinine, Urine: 212 mg/dL
Protein Creatinine Ratio: 0.13 mg/mg{Cre} (ref 0.00–0.15)
Total Protein, Urine: 28 mg/dL

## 2022-05-15 LAB — CBC
HCT: 35.2 % — ABNORMAL LOW (ref 36.0–46.0)
Hemoglobin: 12.3 g/dL (ref 12.0–15.0)
MCH: 29.7 pg (ref 26.0–34.0)
MCHC: 34.9 g/dL (ref 30.0–36.0)
MCV: 85 fL (ref 80.0–100.0)
Platelets: 246 10*3/uL (ref 150–400)
RBC: 4.14 MIL/uL (ref 3.87–5.11)
RDW: 12 % (ref 11.5–15.5)
WBC: 7.6 10*3/uL (ref 4.0–10.5)
nRBC: 0 % (ref 0.0–0.2)

## 2022-05-15 LAB — URINALYSIS, ROUTINE W REFLEX MICROSCOPIC
Bilirubin Urine: NEGATIVE
Glucose, UA: NEGATIVE mg/dL
Hgb urine dipstick: NEGATIVE
Ketones, ur: NEGATIVE mg/dL
Leukocytes,Ua: NEGATIVE
Nitrite: NEGATIVE
Protein, ur: 30 mg/dL — AB
Specific Gravity, Urine: 1.015 (ref 1.005–1.030)
pH: 6 (ref 5.0–8.0)

## 2022-05-15 NOTE — MAU Provider Note (Signed)
History     CSN: [id number]  Arrival date and time: 05/15/22 1157   Event Date/Time   First Provider Initiated Contact with Patient 05/15/22 1258      Chief Complaint  Patient presents with   Hypertension   HPI  Danielle Padilla is a 26 y.o. G1P0000 at [redacted]w[redacted]d who presents for evaluation of hypertension. Patient reports she went to the office today for her routine visit and was found to be hypertensive. The office reports 1 severe range BP with no recheck. She denies any HA, visual changes or epigastric pain.  She denies any vaginal bleeding, discharge, and leaking of fluid. Denies any constipation, diarrhea or any urinary complaints. Reports normal fetal movement.   OB History     Gravida  1   Para  0   Term  0   Preterm  0   AB  0   Living  0      SAB  0   IAB  0   Ectopic  0   Multiple  0   Live Births  0           Past Medical History:  Diagnosis Date   Asthma    PCOS (polycystic ovarian syndrome)     Past Surgical History:  Procedure Laterality Date   EYE SURGERY      Family History  Problem Relation Age of Onset   Diabetes Mother    Asthma Father    Hypertension Father    Asthma Brother    Cancer Maternal Uncle    Cancer Maternal Grandmother    Diabetes Paternal Grandmother     Social History   Tobacco Use   Smoking status: Never   Smokeless tobacco: Never  Vaping Use   Vaping Use: Never used  Substance Use Topics   Alcohol use: No   Drug use: No    Allergies: No Known Allergies  No medications prior to admission.    Review of Systems  Constitutional: Negative.  Negative for fatigue and fever.  HENT: Negative.    Respiratory: Negative.  Negative for shortness of breath.   Cardiovascular: Negative.  Negative for chest pain.  Gastrointestinal: Negative.  Negative for abdominal pain, constipation, diarrhea, nausea and vomiting.  Genitourinary: Negative.  Negative for dysuria, vaginal bleeding and  vaginal discharge.  Neurological: Negative.  Negative for dizziness and headaches.   Physical Exam   Blood pressure 124/89, pulse 75, temperature 98.3 F (36.8 C), temperature source Oral, resp. rate 16, height 5\' 11"  (1.803 m), weight 125.7 kg, SpO2 99 %.  Patient Vitals for the past 24 hrs:  BP Temp Temp src Pulse Resp SpO2 Height Weight  05/15/22 1431 124/89 -- -- 75 -- -- -- --  05/15/22 1416 128/87 -- -- 78 -- -- -- --  05/15/22 1401 (!) 125/93 -- -- 83 -- -- -- --  05/15/22 1331 (!) 120/95 -- -- 87 -- 99 % -- --  05/15/22 1316 122/85 -- -- 81 -- 99 % -- --  05/15/22 1301 122/84 -- -- 82 -- 99 % -- --  05/15/22 1253 (!) 126/92 98.3 F (36.8 C) Oral 79 16 100 % -- --  05/15/22 1224 -- -- -- -- -- -- 5\' 11"  (1.803 m) 125.7 kg    Physical Exam Vitals and nursing note reviewed.  Constitutional:      General: She is not in acute distress.    Appearance: She is well-developed.  HENT:     Head: Normocephalic.  Eyes:     Pupils: Pupils are equal, round, and reactive to light.  Cardiovascular:     Rate and Rhythm: Normal rate and regular rhythm.     Heart sounds: Normal heart sounds.  Pulmonary:     Effort: Pulmonary effort is normal. No respiratory distress.     Breath sounds: Normal breath sounds.  Abdominal:     General: Bowel sounds are normal. There is no distension.     Palpations: Abdomen is soft.     Tenderness: There is no abdominal tenderness.  Skin:    General: Skin is warm and dry.  Neurological:     Mental Status: She is alert and oriented to person, place, and time.     Motor: No abnormal muscle tone.     Coordination: Coordination normal.     Deep Tendon Reflexes: Reflexes are normal and symmetric. Reflexes normal.  Psychiatric:        Behavior: Behavior normal.        Thought Content: Thought content normal.        Judgment: Judgment normal.    Fetal Tracing:  Baseline: 120 Variability: moderate Accels: 15x15 Decels: none  Toco: none   MAU  Course  Procedures  Results for orders placed or performed during the hospital encounter of 05/15/22 (from the past 24 hour(s))  Protein / creatinine ratio, urine     Status: None   Collection Time: 05/15/22 12:18 PM  Result Value Ref Range   Creatinine, Urine 212 mg/dL   Total Protein, Urine 28 mg/dL   Protein Creatinine Ratio 0.13 0.00 - 0.15 mg/mg[Cre]  Urinalysis, Routine w reflex microscopic     Status: Abnormal   Collection Time: 05/15/22 12:18 PM  Result Value Ref Range   Color, Urine YELLOW YELLOW   APPearance CLOUDY (A) CLEAR   Specific Gravity, Urine 1.015 1.005 - 1.030   pH 6.0 5.0 - 8.0   Glucose, UA NEGATIVE NEGATIVE mg/dL   Hgb urine dipstick NEGATIVE NEGATIVE   Bilirubin Urine NEGATIVE NEGATIVE   Ketones, ur NEGATIVE NEGATIVE mg/dL   Protein, ur 30 (A) NEGATIVE mg/dL   Nitrite NEGATIVE NEGATIVE   Leukocytes,Ua NEGATIVE NEGATIVE   RBC / HPF 0-5 0 - 5 RBC/hpf   WBC, UA 6-10 0 - 5 WBC/hpf   Bacteria, UA FEW (A) NONE SEEN   Squamous Epithelial / LPF 21-50 0 - 5 /HPF   Mucus PRESENT    Non Squamous Epithelial 0-5 (A) NONE SEEN  CBC     Status: Abnormal   Collection Time: 05/15/22 12:34 PM  Result Value Ref Range   WBC 7.6 4.0 - 10.5 K/uL   RBC 4.14 3.87 - 5.11 MIL/uL   Hemoglobin 12.3 12.0 - 15.0 g/dL   HCT 97.6 (L) 73.4 - 19.3 %   MCV 85.0 80.0 - 100.0 fL   MCH 29.7 26.0 - 34.0 pg   MCHC 34.9 30.0 - 36.0 g/dL   RDW 79.0 24.0 - 97.3 %   Platelets 246 150 - 400 K/uL   nRBC 0.0 0.0 - 0.2 %  Comprehensive metabolic panel     Status: Abnormal   Collection Time: 05/15/22 12:34 PM  Result Value Ref Range   Sodium 138 135 - 145 mmol/L   Potassium 4.1 3.5 - 5.1 mmol/L   Chloride 107 98 - 111 mmol/L   CO2 22 22 - 32 mmol/L   Glucose, Bld 84 70 - 99 mg/dL   BUN 8 6 - 20 mg/dL   Creatinine, Ser  0.79 0.44 - 1.00 mg/dL   Calcium 9.9 8.9 - 50.0 mg/dL   Total Protein 6.6 6.5 - 8.1 g/dL   Albumin 3.2 (L) 3.5 - 5.0 g/dL   AST 26 15 - 41 U/L   ALT 26 0 - 44 U/L    Alkaline Phosphatase 89 38 - 126 U/L   Total Bilirubin 0.5 0.3 - 1.2 mg/dL   GFR, Estimated >93 >81 mL/min   Anion gap 9 5 - 15      MDM Prenatal records from community office reviewed. Pregnancy uncomplicated. Labs ordered and reviewed.   UA CBC, CMP, Protein/creat ratio  Dr. Vincente Poli notified of new onset HTN and need for close follow up- MD recommends patient call office to schedule BP check next week.  Assessment and Plan   1. Elevated blood pressure affecting pregnancy in third trimester, antepartum   2. [redacted] weeks gestation of pregnancy     -Discharge home in stable condition -Preeclampsia precautions discussed -Patient advised to follow-up with OB for BP check next week -Patient may return to MAU as needed or if her condition were to change or worsen  Rolm Bookbinder, CNM 05/15/2022, 12:58 PM

## 2022-05-15 NOTE — MAU Note (Signed)
Danielle Padilla is a 26 y.o. at [redacted]w[redacted]d here in MAU reporting: sent over from the office due to elevated BP. Had 1 severe range BP in the office. Denies PIH S/S. No labor complaints. +FM  Onset of complaint: today  Pain score: 0/10  Vitals:   05/15/22 1253  BP: (!) 126/92  Pulse: 79  Resp: 16  Temp: 98.3 F (36.8 C)  SpO2: 100%     FHT: EFM applied in room  Lab orders placed from triage: entered by provider

## 2022-05-21 ENCOUNTER — Other Ambulatory Visit: Payer: Self-pay

## 2022-05-21 ENCOUNTER — Inpatient Hospital Stay (HOSPITAL_COMMUNITY): Payer: Managed Care, Other (non HMO)

## 2022-05-21 ENCOUNTER — Observation Stay (HOSPITAL_COMMUNITY)
Admission: AD | Admit: 2022-05-21 | Discharge: 2022-05-23 | Disposition: A | Discharge status: Home / Self Care | Payer: Managed Care, Other (non HMO) | Attending: Obstetrics and Gynecology | Admitting: Obstetrics and Gynecology

## 2022-05-21 ENCOUNTER — Encounter (HOSPITAL_COMMUNITY): Payer: Self-pay | Admitting: Obstetrics and Gynecology

## 2022-05-21 DIAGNOSIS — Z3A25 25 weeks gestation of pregnancy: Secondary | ICD-10-CM | POA: Diagnosis not present

## 2022-05-21 DIAGNOSIS — O133 Gestational [pregnancy-induced] hypertension without significant proteinuria, third trimester: Secondary | ICD-10-CM

## 2022-05-21 DIAGNOSIS — O289 Unspecified abnormal findings on antenatal screening of mother: Secondary | ICD-10-CM

## 2022-05-21 DIAGNOSIS — Z3A34 34 weeks gestation of pregnancy: Secondary | ICD-10-CM

## 2022-05-21 DIAGNOSIS — O368190 Decreased fetal movements, unspecified trimester, not applicable or unspecified: Secondary | ICD-10-CM | POA: Diagnosis not present

## 2022-05-21 DIAGNOSIS — O368130 Decreased fetal movements, third trimester, not applicable or unspecified: Secondary | ICD-10-CM | POA: Diagnosis not present

## 2022-05-21 DIAGNOSIS — Z3689 Encounter for other specified antenatal screening: Secondary | ICD-10-CM

## 2022-05-21 DIAGNOSIS — O288 Other abnormal findings on antenatal screening of mother: Secondary | ICD-10-CM | POA: Diagnosis not present

## 2022-05-21 DIAGNOSIS — Z369 Encounter for antenatal screening, unspecified: Secondary | ICD-10-CM | POA: Diagnosis not present

## 2022-05-21 DIAGNOSIS — O139 Gestational [pregnancy-induced] hypertension without significant proteinuria, unspecified trimester: Secondary | ICD-10-CM | POA: Diagnosis present

## 2022-05-21 DIAGNOSIS — Z3A35 35 weeks gestation of pregnancy: Secondary | ICD-10-CM | POA: Diagnosis not present

## 2022-05-21 DIAGNOSIS — O26893 Other specified pregnancy related conditions, third trimester: Secondary | ICD-10-CM | POA: Diagnosis not present

## 2022-05-21 LAB — COMPREHENSIVE METABOLIC PANEL
ALT: 26 U/L (ref 0–44)
AST: 27 U/L (ref 15–41)
Albumin: 3.2 g/dL — ABNORMAL LOW (ref 3.5–5.0)
Alkaline Phosphatase: 97 U/L (ref 38–126)
Anion gap: 9 (ref 5–15)
BUN: 7 mg/dL (ref 6–20)
CO2: 23 mmol/L (ref 22–32)
Calcium: 9.7 mg/dL (ref 8.9–10.3)
Chloride: 104 mmol/L (ref 98–111)
Creatinine, Ser: 0.78 mg/dL (ref 0.44–1.00)
GFR, Estimated: >60 mL/min (ref >60)
Glucose, Bld: 96 mg/dL (ref 70–99)
Potassium: 3.8 mmol/L (ref 3.5–5.1)
Sodium: 136 mmol/L (ref 135–145)
Total Bilirubin: 0.3 mg/dL (ref 0.3–1.2)
Total Protein: 6.6 g/dL (ref 6.5–8.1)

## 2022-05-21 LAB — CBC
HCT: 35.5 % — ABNORMAL LOW (ref 36.0–46.0)
Hemoglobin: 12.7 g/dL (ref 12.0–15.0)
MCH: 30.3 pg (ref 26.0–34.0)
MCHC: 35.8 g/dL (ref 30.0–36.0)
MCV: 84.7 fL (ref 80.0–100.0)
Platelets: 246 10*3/uL (ref 150–400)
RBC: 4.19 MIL/uL (ref 3.87–5.11)
RDW: 12.2 % (ref 11.5–15.5)
WBC: 8.7 10*3/uL (ref 4.0–10.5)
nRBC: 0 % (ref 0.0–0.2)

## 2022-05-21 LAB — URINALYSIS, ROUTINE W REFLEX MICROSCOPIC
Bilirubin Urine: NEGATIVE
Glucose, UA: NEGATIVE mg/dL
Hgb urine dipstick: NEGATIVE
Ketones, ur: NEGATIVE mg/dL
Leukocytes,Ua: NEGATIVE
Nitrite: NEGATIVE
Protein, ur: 30 mg/dL — AB
Specific Gravity, Urine: 1.005 (ref 1.005–1.030)
pH: 6 (ref 5.0–8.0)

## 2022-05-21 LAB — TYPE AND SCREEN
ABO/RH(D): O POS
Antibody Screen: NEGATIVE

## 2022-05-21 LAB — PROTEIN / CREATININE RATIO, URINE
Creatinine, Urine: 55 mg/dL
Protein Creatinine Ratio: 0.62 mg/mg{Cre} — ABNORMAL HIGH (ref 0.00–0.15)
Total Protein, Urine: 34 mg/dL

## 2022-05-21 MED ORDER — PRENATAL MULTIVITAMIN CH
1.0000 | ORAL_TABLET | Freq: Every day | ORAL | Status: DC
  Administered 2022-05-22 – 2022-05-23 (×2): 1 via ORAL
  Filled 2022-05-21 (×2): qty 1

## 2022-05-21 MED ORDER — BETAMETHASONE SOD PHOS & ACET 6 (3-3) MG/ML IJ SUSP
12.0000 mg | INTRAMUSCULAR | Status: AC
  Administered 2022-05-21 – 2022-05-22 (×2): 12 mg via INTRAMUSCULAR
  Filled 2022-05-21: qty 5

## 2022-05-21 MED ORDER — ACETAMINOPHEN 325 MG PO TABS
650.0000 mg | ORAL_TABLET | ORAL | Status: DC | PRN
  Administered 2022-05-22 – 2022-05-23 (×3): 650 mg via ORAL
  Filled 2022-05-21 (×3): qty 2

## 2022-05-21 MED ORDER — NIFEDIPINE ER OSMOTIC RELEASE 30 MG PO TB24: 30.0000 mg | ORAL_TABLET | Freq: Once | ORAL | Status: DC

## 2022-05-21 MED ORDER — CALCIUM CARBONATE ANTACID 500 MG PO CHEW: 2.0000 | CHEWABLE_TABLET | ORAL | Status: DC | PRN

## 2022-05-21 MED ORDER — LACTATED RINGERS IV SOLN: 125.0000 mL/h | INTRAVENOUS | Status: AC

## 2022-05-21 MED ORDER — DOCUSATE SODIUM 100 MG PO CAPS
100.0000 mg | ORAL_CAPSULE | Freq: Every day | ORAL | Status: DC
  Administered 2022-05-22 – 2022-05-23 (×2): 100 mg via ORAL
  Filled 2022-05-21 (×2): qty 1

## 2022-05-21 NOTE — Progress Notes (Signed)
Repeat BPP in MAU reassuring with score 8/8.  FHT continues to be reactive and reassuring.  BP remain mild range.  Will monitor continuous FHT and BP overnight and repeat BPP tomorrow.  Alpha Gula

## 2022-05-21 NOTE — MAU Note (Signed)
Danielle Padilla is a 27 y.o. at [redacted]w[redacted]d here in MAU reporting: she was sent from [facility] office for fetal monitoring due to [facility] 4/8.  Denies VB or LOF.  Reports decreased fetal movement today. LMP: NA Onset of complaint: today Pain score: 0 Vitals:   05/21/22 1828  BP: (!) 143/100  Pulse: 71  Resp: 19  SpO2: 100%     FHT: 136 bpm Lab orders placed from triage:   UA

## 2022-05-21 NOTE — MAU Provider Note (Signed)
Event Date/Time   First Provider Initiated Contact with Patient 05/21/22 1834      [name] is a 27 y.o. G1P0000 patient who presents to MAU today after being seen in the office with non reassuring fetal testing. She has a complaint of decreased fetal movement. She had a non reactive NST and BPP of 4/8. She was sent to MAU for further evaluation and development of a plan of care.   She has known gestational hypertension. Today she reports a HA of 4/10 all day. She denies any visual changes or epigastric pain.  She denies any abdominal pain, vaginal bleeding or leaking of fluid.  O BP (!) 143/100 (BP Location: Right Arm)   Pulse 71   Resp 19   Ht 5\' 8"  (1.727 m)   Wt 127.8 kg   SpO2 100%   BMI 42.83 kg/m  Physical Exam Vitals and nursing note reviewed.  Constitutional:      General: She is not in acute distress.    Appearance: She is well-developed.  HENT:     Head: Normocephalic.  Eyes:     Pupils: Pupils are equal, round, and reactive to light.  Cardiovascular:     Rate and Rhythm: Normal rate and regular rhythm.     Heart sounds: Normal heart sounds.  Pulmonary:     Effort: Pulmonary effort is normal. No respiratory distress.     Breath sounds: Normal breath sounds.  Abdominal:     General: Bowel sounds are normal. There is no distension.     Palpations: Abdomen is soft.     Tenderness: There is no abdominal tenderness.  Skin:    General: Skin is warm and dry.  Neurological:     Mental Status: She is alert and oriented to person, place, and time.     Motor: No abnormal muscle tone.     Coordination: Coordination normal.     Deep Tendon Reflexes: Reflexes are normal and symmetric. Reflexes normal.  Psychiatric:        Behavior: Behavior normal.        Thought Content: Thought content normal.        Judgment: Judgment normal.   Fetal Tracing:  Baseline: 125 Variability: moderate  Accels: 10x10 Decels: none  Toco:  none   A Medical screening exam complete 1. Gestational hypertension, third trimester   2. [redacted] weeks gestation of pregnancy   3. Non-reactive NST (non-stress test)      P -Medical screening complete -Care turned over to MD  Wende Mott, CNM 05/21/2022 6:34 PM

## 2022-05-21 NOTE — H&P (Signed)
Antepartum History and Physical   Danielle Padilla is a 27 y.o. female G1P0 that presented for abnormal antenatal testing.  She was noted to have new onset HTN on 12/28 in office. She was sent to MAU and had a reassuring workup. She returned to office today for BP check and was noted to be mild range.  She reported minimal to none for headache and had not taken any tylenol.  She reported that fetal movement was lighter today.  NST was done in office but was nonreactive.  A BPP was done and result was 4/8.  She was instructed to remain NPO and present to MAU for [facility] labs, continuous monitoring.  OB History     Gravida  1   Para  0   Term  0   Preterm  0   AB  0   Living  0      SAB  0   IAB  0   Ectopic  0   Multiple  0   Live Births  0          Past Medical History:  Diagnosis Date   Asthma    PCOS (polycystic ovarian syndrome)    Past Surgical History:  Procedure Laterality Date   EYE SURGERY     Family History: family history includes Asthma in her brother and father; Cancer in her maternal grandmother and maternal uncle; Diabetes in her mother and paternal grandmother; Hypertension in her father. Social History:  reports that she has never smoked. She has never used smokeless tobacco. She reports that she does not drink alcohol and does not use drugs.     Maternal Diabetes: No Genetic Screening: Normal Maternal Ultrasounds/Referrals: Normal Fetal Ultrasounds or other Referrals:  see HPI Maternal Substance Abuse:  No Significant Maternal Medications:  None Significant Maternal Lab Results:  None Other Comments:  None  Review of Systems History   Blood pressure (!) 149/107, pulse 74, resp. rate 19, height 5\' 8"  (1.727 m), weight 127.8 kg, SpO2 100 %. Exam Physical Exam  Prenatal labs: ABO, Rh: --/--/O POS (01/03 1841) Antibody: NEG (01/03 1841) Rubella:   RPR:    HBsAg:    HIV:    GBS:     Assessment/Plan: Admit to [facility] for continuous monitoring for abnormal antenatal testing. Upon presentation to MAU, FHT was reactive and reassuring. Discussed case with MFM and given improvement in NST, decision was made to manage expectantly. Discussed a low threshold for delivery given BPP result and GA > 34 weeks Discussed in the setting of abnormal placental function that a preterm IOL carries potential for fetal distress, failed induction.  Discussed that delivery mode recommended may be Cesarean. Repeat BPP to be completed in MAU If NST and BPP results remain discrepant, would consider contraction stress test. gHTN  HELLP labs in process No medications. Will add antihypertensive if BP are severe range.  Continuous EFM and toco BMZ 1/3, 1/4 Diet: NPO. Will advance diet if tracing continues to be reassuring.  DVT Ppx: SCDs    Carlyon Shadow 05/21/2022, 7:42 PM

## 2022-05-22 ENCOUNTER — Observation Stay (HOSPITAL_COMMUNITY): Payer: Managed Care, Other (non HMO)

## 2022-05-22 DIAGNOSIS — O368130 Decreased fetal movements, third trimester, not applicable or unspecified: Secondary | ICD-10-CM | POA: Diagnosis not present

## 2022-05-22 DIAGNOSIS — Z3A34 34 weeks gestation of pregnancy: Secondary | ICD-10-CM | POA: Diagnosis not present

## 2022-05-22 DIAGNOSIS — O133 Gestational [pregnancy-induced] hypertension without significant proteinuria, third trimester: Secondary | ICD-10-CM | POA: Diagnosis not present

## 2022-05-22 MED ORDER — CYCLOBENZAPRINE HCL 10 MG PO TABS
5.0000 mg | ORAL_TABLET | Freq: Once | ORAL | Status: AC
  Administered 2022-05-22: 5 mg via ORAL
  Filled 2022-05-22: qty 1

## 2022-05-22 NOTE — Progress Notes (Signed)
Had HA this am relieved with Tylenol Has mild HA this evening>improved with Tylenol, now rates it a 3/10 No blurry vision, no abdominal pain  Today's Vitals   05/22/22 1627 05/22/22 1630 05/22/22 1713 05/22/22 1814  BP:  (!) 140/84    Pulse:  92    Resp:  17    Temp:  98.7 F (37.1 C)    TempSrc:  Oral    SpO2:  96%    Weight:      Height:      PainSc: 5   3  3     Body mass index is 42.83 kg/m.   Smiling in NAD  FHT cat one, baseline 120, good accels, no decels  A/P: -Equivocal antenatal testing          Reassuring fetal heart tracing-BPP 8/8  last pm and today          Repeat BPP tomorrow          -Gestational Hypertension-has not required antihypertensives           Labs OK           Mild HA>will add cyclobenzaprine 5mg  x 1           - Repeat BPP tomorrow morning          - NICU consult          - BMZ course            - Delivery at 37 weeks or sooner if indicated           -- Possible discharge if BPP and fetal heart tracing are reassuring tomorrow and blood sugars are well controlled

## 2022-05-22 NOTE — Consult Note (Addendum)
MFM Consult Note Patient Name: Danielle Padilla  Patient MRN:   [id number]  Referring provider: Dr. Lorane Gell  Reason for Consult: equivocal antenatal testing   HPI: Danielle Padilla is a 27 y.o. G1P0000 at [redacted]w[redacted]d admitted for equivocal antenatal testing.   She experienced new onset HTN on 12/28 in office. She was sent to MAU and had a reassuring workup. She returned to office today for BP check and was noted to be mild range.  She reported minimal to none for headache and had not taken any tylenol. She reported that fetal movement was lighter.  NST was done in office but was nonreactive but reassuring.  A BPP was done and result was 4/8 after 30 min. Upon admission her fetal heart tracing was reactive and repeat BPP was 8/8. She was admitted for BP observation and prolonged monitoring.   She reports occasional headaches during the night but these have resolved with Tylenol. She denies visual changes, RUQ pain or OB complaints. She understands the plan to watch her overnight and reassess in the morning.   Review of Systems: A review of systems was performed and was negative except per HPI   Past Obstetrical History:  OB History  Gravida Para Term Preterm AB Living  1 0 0 0 0 0  SAB IAB Ectopic Multiple Live Births  0 0 0 0 0    # Outcome Date GA Lbr Len/2nd Weight Sex Delivery Anes PTL Lv  1 Current              Past Gynecologic History:  Not discussed  Past Medical History:  Past Medical History:  Diagnosis Date   Asthma    PCOS (polycystic ovarian syndrome)     Past Surgical History:    Past Surgical History:  Procedure Laterality Date   EYE SURGERY      Family History:   family history includes Asthma in her brother and father; Cancer in her maternal grandmother and maternal uncle; Diabetes in her mother and paternal grandmother; Hypertension in her father.    Social History:   Social History   Socioeconomic History   Marital status:  Married    Spouse name: Not on file   Number of children: Not on file   Years of education: Not on file   Highest education level: Not on file  Occupational History   Not on file  Tobacco Use   Smoking status: Never   Smokeless tobacco: Never  Vaping Use   Vaping Use: Never used  Substance and Sexual Activity   Alcohol use: No   Drug use: No   Sexual activity: Yes    Partners: Male  Other Topics Concern   Not on file  Social History Narrative   Not on file   Social Determinants of Health   Financial Resource Strain: Not on file  Food Insecurity: No Food Insecurity (05/21/2022)   Hunger Vital Sign    Worried About Running Out of Food in the Last Year: Never true    Ran Out of Food in the Last Year: Never true  Transportation Needs: No Transportation Needs (05/21/2022)   PRAPARE - Administrator, Civil Service (Medical): No    Lack of Transportation (Non-Medical): No  Physical Activity: Not on file  Stress: Not on file  Social Connections: Not on file  Intimate Partner Violence: Not At Risk (05/21/2022)   Humiliation, Afraid, Rape, and Kick questionnaire    Fear of Current or Ex-Partner:  No    Emotionally Abused: No    Physically Abused: No    Sexually Abused: No      Home Medications:   No current facility-administered medications on file prior to encounter.   Current Outpatient Medications on File Prior to Encounter  Medication Sig Dispense Refill   Prenatal Vit-Fe Fumarate-FA (PRENATAL MULTIVITAMIN) TABS tablet Take 1 tablet by mouth daily at 12 noon.     omeprazole (PRILOSEC) 20 MG capsule Take 1 capsule (20 mg total) by mouth 2 (two) times daily before a meal. 60 capsule 3      Allergies:   No Known Allergies   Physical Exam:      05/22/2022   12:31 PM 05/22/2022    8:00 AM 05/22/2022    4:09 AM  Vitals with BMI  Systolic 139 140 161  Diastolic 85 91 80  Pulse 95 81 90    Sitting comfortably on the sonogram table Nonlabored breathing Normal rate  and rhythm Abdomen is nontender  Assessment  Danielle Padilla is a 27 y.o. G1P0000 at [redacted]w[redacted]d   1. Equivocal antenatal testing 2. Gestational hypertension   - Likely due to fetal sleep cycle given the discordance between the fetal heart tracing and BPP. Repeat BPP was also 8/8.  - Since the tracing has been reactive without decels, she can have an NST every 8 hours as long as it is reactive. Fetal heart baseline is around 110, but had excellent variability and accels without decels.  - Repeat BPP tomorrow morning - NICU consult - BMZ course  - Not currently requiring antihypertensive medications, BP goal of < 140/90 - Delivery at 37 weeks or sooner if indicated  - Possible discharge if BPP and fetal heart tracing are reassuring tomorrow and blood sugars are well controlled.   60 minutes was spent reviewing the patients chart and in direct patient counseling.  Thank you for the opportunity to be involved with this patient's care. Please let us know if we can be of any further assistance.   Braxton Feathers  MFM, Geraldine   CODE: [id number]  05/22/2022  12:45 PM

## 2022-05-22 NOTE — Progress Notes (Signed)
Had mild HA last night and again this am. Tylenol relieved HA and now she feels good. Good FM  Today's Vitals   05/22/22 0740 05/22/22 0800 05/22/22 0826 05/22/22 0842  BP:  (!) 140/91    Pulse:  81    Resp:  18    Temp:  97.8 F (36.6 C)    TempSrc:  Oral    SpO2:  98%    Weight:      Height:      PainSc: 0-No pain  6  6    Body mass index is 42.83 kg/m.   NST basline 100s with good accels  BPP done and report pending  A/P: Awaiting BPP report and MFM consultation         D/W patient

## 2022-05-23 ENCOUNTER — Inpatient Hospital Stay (HOSPITAL_BASED_OUTPATIENT_CLINIC_OR_DEPARTMENT_OTHER): Payer: Managed Care, Other (non HMO)

## 2022-05-23 DIAGNOSIS — Z3689 Encounter for other specified antenatal screening: Secondary | ICD-10-CM

## 2022-05-23 DIAGNOSIS — Z369 Encounter for antenatal screening, unspecified: Secondary | ICD-10-CM

## 2022-05-23 DIAGNOSIS — O368130 Decreased fetal movements, third trimester, not applicable or unspecified: Secondary | ICD-10-CM | POA: Diagnosis not present

## 2022-05-23 DIAGNOSIS — Z3A35 35 weeks gestation of pregnancy: Secondary | ICD-10-CM

## 2022-05-23 DIAGNOSIS — O133 Gestational [pregnancy-induced] hypertension without significant proteinuria, third trimester: Secondary | ICD-10-CM | POA: Diagnosis not present

## 2022-05-23 LAB — CBC
HCT: 32.4 % — ABNORMAL LOW (ref 36.0–46.0)
Hemoglobin: 11.7 g/dL — ABNORMAL LOW (ref 12.0–15.0)
MCH: 30.5 pg (ref 26.0–34.0)
MCHC: 36.1 g/dL — ABNORMAL HIGH (ref 30.0–36.0)
MCV: 84.4 fL (ref 80.0–100.0)
Platelets: 228 10*3/uL (ref 150–400)
RBC: 3.84 MIL/uL — ABNORMAL LOW (ref 3.87–5.11)
RDW: 12.4 % (ref 11.5–15.5)
WBC: 10.4 10*3/uL (ref 4.0–10.5)
nRBC: 0 % (ref 0.0–0.2)

## 2022-05-23 LAB — COMPREHENSIVE METABOLIC PANEL
ALT: 33 U/L (ref 0–44)
AST: 31 U/L (ref 15–41)
Albumin: 3 g/dL — ABNORMAL LOW (ref 3.5–5.0)
Alkaline Phosphatase: 83 U/L (ref 38–126)
Anion gap: 10 (ref 5–15)
BUN: 13 mg/dL (ref 6–20)
CO2: 22 mmol/L (ref 22–32)
Calcium: 9.1 mg/dL (ref 8.9–10.3)
Chloride: 106 mmol/L (ref 98–111)
Creatinine, Ser: 0.9 mg/dL (ref 0.44–1.00)
GFR, Estimated: >60 mL/min (ref >60)
Glucose, Bld: 147 mg/dL — ABNORMAL HIGH (ref 70–99)
Potassium: 3.8 mmol/L (ref 3.5–5.1)
Sodium: 138 mmol/L (ref 135–145)
Total Bilirubin: 0.5 mg/dL (ref 0.3–1.2)
Total Protein: 5.8 g/dL — ABNORMAL LOW (ref 6.5–8.1)

## 2022-05-23 NOTE — Plan of Care (Signed)

## 2022-05-23 NOTE — Progress Notes (Signed)
   05/23/22 1743  Departure Condition  Departure Condition Good  Mobility at [facility]  Patient/Caregiver Teaching Teach Back Method Used;Discharge instructions reviewed;Follow-up care reviewed;Patient/caregiver verbalized understanding;Pain management discussed;Educated about hypertension in pregnancy  Departure Mode With significant other  Was procedural sedation performed on this patient during this visit? No   Patient alert and oriented x4, VS and pain stable.

## 2022-05-23 NOTE — Progress Notes (Signed)
Progress Note  Feeling well, no complaints. No HA. Bps still normal to low mild range.  BPP and NST reviewed with Dr. Epimenio Sarin. Discussed results with the patient and her partner - 8/10 BPP, overall reassuring. Strict FKC precautions discussed with them in addition to preE precautions. Will repeat CBC/CMP now.   Likely plan for discharge today if labs return wnl with close f/u in the office - BPP scheduled for Monday. She understands reasons to return or call back.  Hurshel Party, MD

## 2022-05-23 NOTE — Progress Notes (Signed)
Antepartum Progress Note  S: Feeling well this AM, no headache  Today's Vitals   05/22/22 1955 05/22/22 2003 05/23/22 0003 05/23/22 0441  BP:  (!) 141/97 134/88 (!) 132/90  Pulse:  72 88 77  Resp:  19 19 18   Temp:  98.5 F (36.9 C) 98.4 F (36.9 C) 97.8 F (36.6 C)  TempSrc:  Oral Oral Oral  SpO2:  100% 99% 98%  Weight:      Height:      PainSc: 0-No pain      Body mass index is 42.83 kg/m.  Gen: NAD Abd: soft, ND, NT, gravid  NST reviewed, reactive  A/P: 27yo G1P0 at [redacted]w[redacted]d admitted for equivocal antenatal testing in the office Reassuring fetal heart tracing since admission BPP 8/8 in MAU and yesterday AM - repeat BPP this morning S/p BMZ course 1/3-4 For NICU consult  Gestational Hypertension - has not required antihypertensives, no preE sxs - labs wnl - intermittent mild HA - resolves with medications; none this morning - delivery timing 37 weeks, sooner if indicated  Dispo: pending BPP, possible dc today  Hurshel Party, MD

## 2022-05-23 NOTE — Discharge Summary (Signed)
Physician Discharge Summary  Patient ID: Danielle Padilla MRN: [id number] DOB/AGE: [birth year] 25 y.o.  Admit date: 05/21/2022 Discharge date: 05/23/2022  Admission Diagnoses: Nonreassuring antenatal fetal testing Gestational hypertension  Discharge Diagnoses:  Principal Problem:   Gestational hypertension, third trimester Active Problems:   NST (non-stress test) reactive on fetal surveillance   Discharged Condition: good  Hospital Course: Patient was admitted for equivocal antenatal testing in the office. A repeat BPP in the MAU on presentation was 10/10. She was admitted and received a steroid course. Her HA improved with medications. On HD2, her BPP remained 10/10. Her BPP today was 8/10 and she remained asymptomatic from a preeclampsia standpoint. Her labs remained within normal limits. She was discharged home in stable condition with close f/u in the office on Monday for a BPP. Strict FKC and preE precautions were discussed with her prior to discharge.  Consults:  MFM  Significant Diagnostic Studies: labs:  Recent Results (from the past 2160 hour(s))  Protein / creatinine ratio, urine     Status: None   Collection Time: 05/15/22 12:18 PM  Result Value Ref Range   Creatinine, Urine 212 mg/dL   Total Protein, Urine 28 mg/dL    Comment: NO NORMAL RANGE ESTABLISHED FOR THIS TEST   Protein Creatinine Ratio 0.13 0.00 - 0.15 mg/mg[Cre]    Comment: Performed at [facility] [address] [place], [place] [address]  Urinalysis, Routine w reflex microscopic     Status: Abnormal   Collection Time: 05/15/22 12:18 PM  Result Value Ref Range   Color, Urine YELLOW YELLOW   APPearance CLOUDY (A) CLEAR   Specific Gravity, Urine 1.015 1.005 - 1.030   pH 6.0 5.0 - 8.0   Glucose, UA NEGATIVE NEGATIVE mg/dL   Hgb urine dipstick NEGATIVE NEGATIVE   Bilirubin Urine NEGATIVE NEGATIVE   Ketones, ur NEGATIVE NEGATIVE mg/dL   Protein, ur 30 (A) NEGATIVE mg/dL   Nitrite  NEGATIVE NEGATIVE   Leukocytes,Ua NEGATIVE NEGATIVE   RBC / HPF 0-5 0 - 5 RBC/hpf   WBC, UA 6-10 0 - 5 WBC/hpf   Bacteria, UA FEW (A) NONE SEEN   Squamous Epithelial / HPF 21-50 0 - 5 /HPF   Mucus PRESENT    Non Squamous Epithelial 0-5 (A) NONE SEEN    Comment: Performed at [facility], [place] [address] [place], [place] [address]  CBC     Status: Abnormal   Collection Time: 05/15/22 12:34 PM  Result Value Ref Range   WBC 7.6 4.0 - 10.5 K/uL   RBC 4.14 3.87 - 5.11 MIL/uL   Hemoglobin 12.3 12.0 - 15.0 g/dL   HCT 35.2 (L) 36.0 - 46.0 %   MCV 85.0 80.0 - 100.0 fL   MCH 29.7 26.0 - 34.0 pg   MCHC 34.9 30.0 - 36.0 g/dL   RDW 12.0 11.5 - 15.5 %   Platelets 246 150 - 400 K/uL   nRBC 0.0 0.0 - 0.2 %    Comment: Performed at [facility], [place] [address] [place], [place] [address]  Comprehensive metabolic panel     Status: Abnormal   Collection Time: 05/15/22 12:34 PM  Result Value Ref Range   Sodium 138 135 - 145 mmol/L   Potassium 4.1 3.5 - 5.1 mmol/L   Chloride 107 98 - 111 mmol/L   CO2 22 22 - 32 mmol/L   Glucose, Bld 84 70 - 99 mg/dL    Comment: Glucose reference range applies only  to samples taken after fasting for at least 8 hours.   BUN 8 6 - 20 mg/dL   Creatinine, Ser 5.36 0.44 - 1.00 mg/dL   Calcium 9.9 8.9 - 64.4 mg/dL   Total Protein 6.6 6.5 - 8.1 g/dL   Albumin 3.2 (L) 3.5 - 5.0 g/dL   AST 26 15 - 41 U/L   ALT 26 0 - 44 U/L   Alkaline Phosphatase 89 38 - 126 U/L   Total Bilirubin 0.5 0.3 - 1.2 mg/dL   GFR, Estimated >03 >47 mL/min    Comment: (NOTE) Calculated using the CKD-EPI Creatinine Equation (2021)    Anion gap 9 5 - 15    Comment: Performed at [facility], [address], [place], [place] [address]  Protein / creatinine ratio, urine     Status: Abnormal   Collection Time: 05/21/22  6:24 PM  Result Value Ref Range   Creatinine, Urine 55 mg/dL   Total Protein, Urine 34 mg/dL    Comment: NO NORMAL RANGE ESTABLISHED FOR THIS TEST   Protein  Creatinine Ratio 0.62 (H) 0.00 - 0.15 mg/mg[Cre]    Comment: Performed at [facility], [address] [place], [place] [address]  Urinalysis, Routine w reflex microscopic Urine, Clean Catch     Status: Abnormal   Collection Time: 05/21/22  6:33 PM  Result Value Ref Range   Color, Urine YELLOW YELLOW   APPearance HAZY (A) CLEAR   Specific Gravity, Urine 1.005 1.005 - 1.030   pH 6.0 5.0 - 8.0   Glucose, UA NEGATIVE NEGATIVE mg/dL   Hgb urine dipstick NEGATIVE NEGATIVE   Bilirubin Urine NEGATIVE NEGATIVE   Ketones, ur NEGATIVE NEGATIVE mg/dL   Protein, ur 30 (A) NEGATIVE mg/dL   Nitrite NEGATIVE NEGATIVE   Leukocytes,Ua NEGATIVE NEGATIVE   RBC / HPF 0-5 0 - 5 RBC/hpf   WBC, UA 0-5 0 - 5 WBC/hpf   Bacteria, UA RARE (A) NONE SEEN   Squamous Epithelial / HPF 11-20 0 - 5 /HPF    Comment: Performed at [facility], [address] [place], [place] [address]  Type and screen [facility]     Status: None   Collection Time: 05/21/22  6:41 PM  Result Value Ref Range   ABO/RH(D) O POS    Antibody Screen NEG    Sample Expiration      05/24/2022,2359 Performed at [facility], [address] [place], [place] [address]   CBC     Status: Abnormal   Collection Time: 05/21/22  6:42 PM  Result Value Ref Range   WBC 8.7 4.0 - 10.5 K/uL   RBC 4.19 3.87 - 5.11 MIL/uL   Hemoglobin 12.7 12.0 - 15.0 g/dL   HCT 41.6 (L) 60.6 - 30.1 %   MCV 84.7 80.0 - 100.0 fL   MCH 30.3 26.0 - 34.0 pg   MCHC 35.8 30.0 - 36.0 g/dL   RDW 60.1 09.3 - 23.5 %   Platelets 246 150 - 400 K/uL   nRBC 0.0 0.0 - 0.2 %    Comment: Performed at [facility], [address], [place], [place] [address]  Comprehensive metabolic panel     Status: Abnormal   Collection Time: 05/21/22  6:42 PM  Result Value Ref Range   Sodium 136 135 - 145 mmol/L   Potassium 3.8 3.5 - 5.1 mmol/L   Chloride 104 98 - 111 mmol/L   CO2 23 22 -  32 mmol/L   Glucose, Bld 96 70 - 99 mg/dL    Comment:  Glucose reference range applies only to samples taken after fasting for at least 8 hours.   BUN 7 6 - 20 mg/dL   Creatinine, Ser 6.38 0.44 - 1.00 mg/dL   Calcium 9.7 8.9 - 45.3 mg/dL   Total Protein 6.6 6.5 - 8.1 g/dL   Albumin 3.2 (L) 3.5 - 5.0 g/dL   AST 27 15 - 41 U/L   ALT 26 0 - 44 U/L   Alkaline Phosphatase 97 38 - 126 U/L   Total Bilirubin 0.3 0.3 - 1.2 mg/dL   GFR, Estimated >64 >68 mL/min    Comment: (NOTE) Calculated using the CKD-EPI Creatinine Equation (2021)    Anion gap 9 5 - 15    Comment: Performed at [facility], [address], [place], [place] [address]  CBC     Status: Abnormal   Collection Time: 05/23/22  1:49 PM  Result Value Ref Range   WBC 10.4 4.0 - 10.5 K/uL   RBC 3.84 (L) 3.87 - 5.11 MIL/uL   Hemoglobin 11.7 (L) 12.0 - 15.0 g/dL   HCT 24.8 (L) 25.0 - 03.7 %   MCV 84.4 80.0 - 100.0 fL   MCH 30.5 26.0 - 34.0 pg   MCHC 36.1 (H) 30.0 - 36.0 g/dL   RDW 04.8 88.9 - 16.9 %   Platelets 228 150 - 400 K/uL   nRBC 0.0 0.0 - 0.2 %    Comment: Performed at [facility], [address], [place], [place] [address]  Comprehensive metabolic panel     Status: Abnormal   Collection Time: 05/23/22  1:49 PM  Result Value Ref Range   Sodium 138 135 - 145 mmol/L   Potassium 3.8 3.5 - 5.1 mmol/L   Chloride 106 98 - 111 mmol/L   CO2 22 22 - 32 mmol/L   Glucose, Bld 147 (H) 70 - 99 mg/dL    Comment: Glucose reference range applies only to samples taken after fasting for at least 8 hours.   BUN 13 6 - 20 mg/dL   Creatinine, Ser 8.82 0.44 - 1.00 mg/dL   Calcium 9.1 8.9 - 80.0 mg/dL   Total Protein 5.8 (L) 6.5 - 8.1 g/dL   Albumin 3.0 (L) 3.5 - 5.0 g/dL   AST 31 15 - 41 U/L   ALT 33 0 - 44 U/L   Alkaline Phosphatase 83 38 - 126 U/L   Total Bilirubin 0.5 0.3 - 1.2 mg/dL   GFR, Estimated >34 >91 mL/min    Comment: (NOTE) Calculated using the CKD-EPI Creatinine Equation (2021)    Anion gap 10 5 - 15    Comment: Performed at [facility], [address] [place], [place] [address]     Treatments: BMZ  Disposition:  Discharge disposition: 01-Home or Self Care       Discharge Instructions     Discharge activity:  No Restrictions   Complete by: As directed    Discharge diet:  No restrictions   Complete by: As directed    Fetal Kick Count:  Lie on our left side for one hour after a meal, and count the number of times your baby kicks.  If it is less than 5 times, get up, move around and drink some juice.  Repeat the test 30 minutes later.  If it is still less than 5 kicks in an hour, notify your doctor.  Complete by: As directed    No sexual activity restrictions   Complete by: As directed    Notify physician for a general feeling that "something is not right"   Complete by: As directed    Notify physician for increase or change in vaginal discharge   Complete by: As directed    Notify physician for intestinal cramps, with or without diarrhea, sometimes described as "gas pain"   Complete by: As directed    Notify physician for leaking of fluid   Complete by: As directed    Notify physician for low, dull backache, unrelieved by heat or Tylenol   Complete by: As directed    Notify physician for menstrual like cramps   Complete by: As directed    Notify physician for pelvic pressure   Complete by: As directed    Notify physician for uterine contractions.  These may be painless and feel like the uterus is tightening or the baby is  "balling up"   Complete by: As directed    Notify physician for vaginal bleeding   Complete by: As directed    PRETERM LABOR:  Includes any of the follwing symptoms that occur between 20 - [redacted] weeks gestation.  If these symptoms are not stopped, preterm labor can result in preterm delivery, placing your baby at risk   Complete by: As directed       Allergies as of 05/23/2022   No Known Allergies      Medication List     TAKE these medications    omeprazole 20 MG capsule Commonly known as:  PRILOSEC Take 1 capsule (20 mg total) by mouth 2 (two) times daily before a meal.   prenatal multivitamin Tabs tablet Take 1 tablet by mouth daily at 12 noon.         Signed: Charlotta Newton 05/23/2022, 5:08 PM

## 2022-05-29 LAB — OB RESULTS CONSOLE GBS: GBS: NEGATIVE

## 2022-06-02 ENCOUNTER — Encounter (HOSPITAL_COMMUNITY): Payer: Self-pay | Admitting: *Deleted

## 2022-06-02 ENCOUNTER — Telehealth (HOSPITAL_COMMUNITY): Payer: Self-pay | Admitting: *Deleted

## 2022-06-02 NOTE — Telephone Encounter (Signed)
Preadmission screen  

## 2022-06-06 ENCOUNTER — Encounter (HOSPITAL_COMMUNITY)
Admission: RE | Disposition: A | Discharge status: Home / Self Care | Payer: Self-pay | Source: Home / Self Care | Attending: Obstetrics and Gynecology

## 2022-06-06 ENCOUNTER — Inpatient Hospital Stay (HOSPITAL_COMMUNITY)
Admission: RE | Admit: 2022-06-06 | Discharge: 2022-06-08 | DRG: 788 | Disposition: A | Discharge status: Home / Self Care | Payer: Managed Care, Other (non HMO) | Attending: Obstetrics and Gynecology | Admitting: Obstetrics and Gynecology

## 2022-06-06 ENCOUNTER — Other Ambulatory Visit: Payer: Self-pay

## 2022-06-06 ENCOUNTER — Inpatient Hospital Stay (HOSPITAL_COMMUNITY): Payer: Managed Care, Other (non HMO)

## 2022-06-06 ENCOUNTER — Encounter (HOSPITAL_COMMUNITY): Payer: Self-pay | Admitting: Obstetrics and Gynecology

## 2022-06-06 DIAGNOSIS — Z3A37 37 weeks gestation of pregnancy: Secondary | ICD-10-CM

## 2022-06-06 DIAGNOSIS — O134 Gestational [pregnancy-induced] hypertension without significant proteinuria, complicating childbirth: Secondary | ICD-10-CM | POA: Diagnosis present

## 2022-06-06 DIAGNOSIS — O99214 Obesity complicating childbirth: Secondary | ICD-10-CM | POA: Diagnosis present

## 2022-06-06 DIAGNOSIS — J45909 Unspecified asthma, uncomplicated: Secondary | ICD-10-CM | POA: Diagnosis not present

## 2022-06-06 DIAGNOSIS — O9952 Diseases of the respiratory system complicating childbirth: Secondary | ICD-10-CM | POA: Diagnosis not present

## 2022-06-06 DIAGNOSIS — O133 Gestational [pregnancy-induced] hypertension without significant proteinuria, third trimester: Secondary | ICD-10-CM | POA: Diagnosis present

## 2022-06-06 LAB — TYPE AND SCREEN
ABO/RH(D): O POS
Antibody Screen: NEGATIVE

## 2022-06-06 LAB — COMPREHENSIVE METABOLIC PANEL
ALT: 24 U/L (ref 0–44)
AST: 26 U/L (ref 15–41)
Albumin: 2.7 g/dL — ABNORMAL LOW (ref 3.5–5.0)
Alkaline Phosphatase: 107 U/L (ref 38–126)
Anion gap: 8 (ref 5–15)
BUN: 14 mg/dL (ref 6–20)
CO2: 23 mmol/L (ref 22–32)
Calcium: 9.2 mg/dL (ref 8.9–10.3)
Chloride: 104 mmol/L (ref 98–111)
Creatinine, Ser: 0.89 mg/dL (ref 0.44–1.00)
GFR, Estimated: >60 mL/min (ref >60)
Glucose, Bld: 107 mg/dL — ABNORMAL HIGH (ref 70–99)
Potassium: 3.9 mmol/L (ref 3.5–5.1)
Sodium: 135 mmol/L (ref 135–145)
Total Bilirubin: 0.3 mg/dL (ref 0.3–1.2)
Total Protein: 6.1 g/dL — ABNORMAL LOW (ref 6.5–8.1)

## 2022-06-06 LAB — CBC
HCT: 36.2 % (ref 36.0–46.0)
Hemoglobin: 13.2 g/dL (ref 12.0–15.0)
MCH: 31.1 pg (ref 26.0–34.0)
MCHC: 36.5 g/dL — ABNORMAL HIGH (ref 30.0–36.0)
MCV: 85.2 fL (ref 80.0–100.0)
Platelets: 269 10*3/uL (ref 150–400)
RBC: 4.25 MIL/uL (ref 3.87–5.11)
RDW: 12.7 % (ref 11.5–15.5)
WBC: 8.7 10*3/uL (ref 4.0–10.5)
nRBC: 0 % (ref 0.0–0.2)

## 2022-06-06 LAB — URIC ACID: Uric Acid, Serum: 6.6 mg/dL (ref 2.5–7.1)

## 2022-06-06 LAB — RPR: RPR Ser Ql: NONREACTIVE

## 2022-06-06 SURGERY — Surgical Case
Anesthesia: Spinal

## 2022-06-06 MED ORDER — FENTANYL CITRATE (PF) 100 MCG/2ML IJ SOLN
INTRAMUSCULAR | Status: AC
  Filled 2022-06-06: qty 2

## 2022-06-06 MED ORDER — DEXAMETHASONE SODIUM PHOSPHATE 4 MG/ML IJ SOLN
INTRAMUSCULAR | Status: AC
  Filled 2022-06-06: qty 2

## 2022-06-06 MED ORDER — NIFEDIPINE ER OSMOTIC RELEASE 30 MG PO TB24
30.0000 mg | ORAL_TABLET | Freq: Every day | ORAL | Status: DC
  Administered 2022-06-06: 30 mg via ORAL
  Filled 2022-06-06: qty 1

## 2022-06-06 MED ORDER — ACETAMINOPHEN 325 MG PO TABS: 650.0000 mg | ORAL_TABLET | ORAL | Status: DC | PRN

## 2022-06-06 MED ORDER — BUPIVACAINE IN DEXTROSE 0.75-8.25 % IT SOLN
INTRATHECAL | Status: DC | PRN
  Administered 2022-06-06: 1.5 mL via INTRATHECAL

## 2022-06-06 MED ORDER — ZOLPIDEM TARTRATE 5 MG PO TABS: 5.0000 mg | ORAL_TABLET | Freq: Every evening | ORAL | Status: DC | PRN

## 2022-06-06 MED ORDER — KETOROLAC TROMETHAMINE 30 MG/ML IJ SOLN: 30.0000 mg | Freq: Four times a day (QID) | INTRAMUSCULAR | Status: DC | PRN

## 2022-06-06 MED ORDER — OXYTOCIN-SODIUM CHLORIDE 30-0.9 UT/500ML-% IV SOLN: 2.5000 [IU]/h | INTRAVENOUS | Status: DC

## 2022-06-06 MED ORDER — OXYTOCIN-SODIUM CHLORIDE 30-0.9 UT/500ML-% IV SOLN
INTRAVENOUS | Status: DC | PRN
  Administered 2022-06-06: 325 mL via INTRAVENOUS

## 2022-06-06 MED ORDER — MISOPROSTOL 50MCG HALF TABLET
50.0000 ug | ORAL_TABLET | Freq: Once | ORAL | Status: AC
  Administered 2022-06-06: 50 ug via BUCCAL
  Filled 2022-06-06: qty 1

## 2022-06-06 MED ORDER — FENTANYL CITRATE (PF) 100 MCG/2ML IJ SOLN
INTRAMUSCULAR | Status: DC | PRN
  Administered 2022-06-06: 15 ug via INTRATHECAL

## 2022-06-06 MED ORDER — SODIUM CHLORIDE 0.9% FLUSH: 3.0000 mL | INTRAVENOUS | Status: DC | PRN

## 2022-06-06 MED ORDER — OXYTOCIN BOLUS FROM INFUSION: 333.0000 mL | Freq: Once | INTRAVENOUS | Status: DC

## 2022-06-06 MED ORDER — DIPHENHYDRAMINE HCL 25 MG PO CAPS: 25.0000 mg | ORAL_CAPSULE | ORAL | Status: DC | PRN

## 2022-06-06 MED ORDER — TRANEXAMIC ACID-NACL 1000-0.7 MG/100ML-% IV SOLN
1000.0000 mg | INTRAVENOUS | Status: AC
  Administered 2022-06-06: 1000 mg via INTRAVENOUS

## 2022-06-06 MED ORDER — ACETAMINOPHEN 500 MG PO TABS
1000.0000 mg | ORAL_TABLET | Freq: Four times a day (QID) | ORAL | Status: AC
  Administered 2022-06-07 (×3): 1000 mg via ORAL
  Filled 2022-06-06 (×3): qty 2

## 2022-06-06 MED ORDER — HYDROMORPHONE HCL 1 MG/ML IJ SOLN
0.2500 mg | INTRAMUSCULAR | Status: DC | PRN
  Administered 2022-06-06: 0.5 mg via INTRAVENOUS

## 2022-06-06 MED ORDER — ONDANSETRON HCL 4 MG/2ML IJ SOLN: 4.0000 mg | Freq: Three times a day (TID) | INTRAMUSCULAR | Status: DC | PRN

## 2022-06-06 MED ORDER — AMISULPRIDE (ANTIEMETIC) 5 MG/2ML IV SOLN: 10.0000 mg | Freq: Once | INTRAVENOUS | Status: DC | PRN

## 2022-06-06 MED ORDER — LABETALOL HCL 5 MG/ML IV SOLN: 80.0000 mg | INTRAVENOUS | Status: DC | PRN

## 2022-06-06 MED ORDER — PHENYLEPHRINE HCL-NACL 20-0.9 MG/250ML-% IV SOLN
INTRAVENOUS | Status: DC | PRN
  Administered 2022-06-06: 60 ug/min via INTRAVENOUS

## 2022-06-06 MED ORDER — ONDANSETRON HCL 4 MG/2ML IJ SOLN
INTRAMUSCULAR | Status: AC
  Filled 2022-06-06: qty 2

## 2022-06-06 MED ORDER — OXYCODONE HCL 5 MG PO TABS: 5.0000 mg | ORAL_TABLET | Freq: Once | ORAL | Status: DC | PRN

## 2022-06-06 MED ORDER — OXYTOCIN-SODIUM CHLORIDE 30-0.9 UT/500ML-% IV SOLN
INTRAVENOUS | Status: AC
  Filled 2022-06-06: qty 500

## 2022-06-06 MED ORDER — LACTATED RINGERS IV SOLN: INTRAVENOUS | Status: DC

## 2022-06-06 MED ORDER — MEPERIDINE HCL 25 MG/ML IJ SOLN: 6.2500 mg | INTRAMUSCULAR | Status: DC | PRN

## 2022-06-06 MED ORDER — MISOPROSTOL 25 MCG QUARTER TABLET
25.0000 ug | ORAL_TABLET | Freq: Once | ORAL | Status: AC
  Administered 2022-06-06: 25 ug via VAGINAL
  Filled 2022-06-06: qty 1

## 2022-06-06 MED ORDER — LABETALOL HCL 5 MG/ML IV SOLN: 40.0000 mg | INTRAVENOUS | Status: DC | PRN

## 2022-06-06 MED ORDER — OXYCODONE HCL 5 MG/5ML PO SOLN
5.0000 mg | Freq: Once | ORAL | Status: DC | PRN
  Filled 2022-06-06: qty 5

## 2022-06-06 MED ORDER — SODIUM CHLORIDE 0.9 % IV SOLN
2.0000 g | Freq: Once | INTRAVENOUS | Status: AC
  Administered 2022-06-06: 2 g via INTRAVENOUS
  Filled 2022-06-06: qty 2

## 2022-06-06 MED ORDER — ACETAMINOPHEN 10 MG/ML IV SOLN
INTRAVENOUS | Status: AC
  Filled 2022-06-06: qty 100

## 2022-06-06 MED ORDER — OXYCODONE-ACETAMINOPHEN 5-325 MG PO TABS: 1.0000 | ORAL_TABLET | ORAL | Status: DC | PRN

## 2022-06-06 MED ORDER — ONDANSETRON HCL 4 MG/2ML IJ SOLN
4.0000 mg | Freq: Once | INTRAMUSCULAR | Status: AC | PRN
  Administered 2022-06-06: 4 mg via INTRAVENOUS

## 2022-06-06 MED ORDER — PHENYLEPHRINE HCL-NACL 20-0.9 MG/250ML-% IV SOLN
INTRAVENOUS | Status: AC
  Filled 2022-06-06: qty 250

## 2022-06-06 MED ORDER — STERILE WATER FOR IRRIGATION IR SOLN
Status: DC | PRN
  Administered 2022-06-06: 1000 mL

## 2022-06-06 MED ORDER — SODIUM CHLORIDE 0.9 % IR SOLN
Status: DC | PRN
  Administered 2022-06-06: 1

## 2022-06-06 MED ORDER — MORPHINE SULFATE (PF) 0.5 MG/ML IJ SOLN
INTRAMUSCULAR | Status: AC
  Filled 2022-06-06: qty 10

## 2022-06-06 MED ORDER — FENTANYL CITRATE (PF) 100 MCG/2ML IJ SOLN: 50.0000 ug | INTRAMUSCULAR | Status: DC | PRN

## 2022-06-06 MED ORDER — HYDRALAZINE HCL 20 MG/ML IJ SOLN: 10.0000 mg | INTRAMUSCULAR | Status: DC | PRN

## 2022-06-06 MED ORDER — NIFEDIPINE ER OSMOTIC RELEASE 30 MG PO TB24: 30.0000 mg | ORAL_TABLET | Freq: Every day | ORAL | Status: DC

## 2022-06-06 MED ORDER — SCOPOLAMINE 1 MG/3DAYS TD PT72
1.0000 | MEDICATED_PATCH | Freq: Once | TRANSDERMAL | Status: DC
  Administered 2022-06-06: 1.5 mg via TRANSDERMAL

## 2022-06-06 MED ORDER — PHENYLEPHRINE 80 MCG/ML (10ML) SYRINGE FOR IV PUSH (FOR BLOOD PRESSURE SUPPORT)
PREFILLED_SYRINGE | INTRAVENOUS | Status: DC | PRN
  Administered 2022-06-06 (×4): 80 ug via INTRAVENOUS

## 2022-06-06 MED ORDER — TERBUTALINE SULFATE 1 MG/ML IJ SOLN: 0.2500 mg | Freq: Once | INTRAMUSCULAR | Status: DC | PRN

## 2022-06-06 MED ORDER — NALOXONE HCL 0.4 MG/ML IJ SOLN: 0.4000 mg | INTRAMUSCULAR | Status: DC | PRN

## 2022-06-06 MED ORDER — ONDANSETRON HCL 4 MG/2ML IJ SOLN
INTRAMUSCULAR | Status: DC | PRN
  Administered 2022-06-06: 4 mg via INTRAVENOUS

## 2022-06-06 MED ORDER — DIPHENHYDRAMINE HCL 50 MG/ML IJ SOLN: 12.5000 mg | INTRAMUSCULAR | Status: DC | PRN

## 2022-06-06 MED ORDER — KETOROLAC TROMETHAMINE 30 MG/ML IJ SOLN
INTRAMUSCULAR | Status: AC
  Filled 2022-06-06: qty 1

## 2022-06-06 MED ORDER — HYDROMORPHONE HCL 1 MG/ML IJ SOLN
INTRAMUSCULAR | Status: AC
  Filled 2022-06-06: qty 0.5

## 2022-06-06 MED ORDER — MISOPROSTOL 50MCG HALF TABLET
50.0000 ug | ORAL_TABLET | ORAL | Status: AC
  Administered 2022-06-06 (×2): 50 ug via ORAL
  Filled 2022-06-06 (×2): qty 1

## 2022-06-06 MED ORDER — ACETAMINOPHEN 10 MG/ML IV SOLN
INTRAVENOUS | Status: DC | PRN
  Administered 2022-06-06: 1000 mg via INTRAVENOUS

## 2022-06-06 MED ORDER — LACTATED RINGERS IV SOLN
500.0000 mL | INTRAVENOUS | Status: DC | PRN
  Administered 2022-06-06: 500 mL via INTRAVENOUS

## 2022-06-06 MED ORDER — LABETALOL HCL 5 MG/ML IV SOLN: 20.0000 mg | INTRAVENOUS | Status: DC | PRN

## 2022-06-06 MED ORDER — DEXAMETHASONE SODIUM PHOSPHATE 10 MG/ML IJ SOLN
INTRAMUSCULAR | Status: DC | PRN
  Administered 2022-06-06: 8 mg via INTRAVENOUS

## 2022-06-06 MED ORDER — TRANEXAMIC ACID-NACL 1000-0.7 MG/100ML-% IV SOLN
INTRAVENOUS | Status: AC
  Filled 2022-06-06: qty 100

## 2022-06-06 MED ORDER — SOD CITRATE-CITRIC ACID 500-334 MG/5ML PO SOLN
30.0000 mL | ORAL | Status: DC | PRN
  Administered 2022-06-06: 30 mL via ORAL
  Filled 2022-06-06: qty 30

## 2022-06-06 MED ORDER — SCOPOLAMINE 1 MG/3DAYS TD PT72
MEDICATED_PATCH | TRANSDERMAL | Status: AC
  Filled 2022-06-06: qty 1

## 2022-06-06 MED ORDER — MORPHINE SULFATE (PF) 0.5 MG/ML IJ SOLN
INTRAMUSCULAR | Status: DC | PRN
  Administered 2022-06-06: 150 ug via INTRATHECAL

## 2022-06-06 MED ORDER — ONDANSETRON HCL 4 MG/2ML IJ SOLN: 4.0000 mg | Freq: Four times a day (QID) | INTRAMUSCULAR | Status: DC | PRN

## 2022-06-06 MED ORDER — LIDOCAINE HCL (PF) 1 % IJ SOLN: 30.0000 mL | INTRAMUSCULAR | Status: DC | PRN

## 2022-06-06 MED ORDER — OXYCODONE-ACETAMINOPHEN 5-325 MG PO TABS: 2.0000 | ORAL_TABLET | ORAL | Status: DC | PRN

## 2022-06-06 MED ORDER — NALOXONE HCL 4 MG/10ML IJ SOLN: 1.0000 ug/kg/h | INTRAVENOUS | Status: DC | PRN

## 2022-06-06 MED ORDER — KETOROLAC TROMETHAMINE 30 MG/ML IJ SOLN
30.0000 mg | Freq: Once | INTRAMUSCULAR | Status: AC | PRN
  Administered 2022-06-06: 30 mg via INTRAVENOUS

## 2022-06-06 SURGICAL SUPPLY — 31 items
APL PRP STRL LF DISP 70% ISPRP (MISCELLANEOUS) ×2
APL SKNCLS STERI-STRIP NONHPOA (GAUZE/BANDAGES/DRESSINGS) ×1
BENZOIN TINCTURE PRP APPL 2/3 (GAUZE/BANDAGES/DRESSINGS) IMPLANT
CHLORAPREP W/TINT 26 (MISCELLANEOUS) ×4 IMPLANT
CLAMP UMBILICAL CORD (MISCELLANEOUS) ×2 IMPLANT
CLOTH BEACON ORANGE TIMEOUT ST (SAFETY) ×2 IMPLANT
DRSG OPSITE POSTOP 4X10 (GAUZE/BANDAGES/DRESSINGS) ×2 IMPLANT
ELECT REM PT RETURN 9FT ADLT (ELECTROSURGICAL) ×1
ELECTRODE REM PT RTRN 9FT ADLT (ELECTROSURGICAL) ×2 IMPLANT
EXTRACTOR VACUUM M CUP 4 TUBE (SUCTIONS) IMPLANT
GLOVE BIOGEL PI IND STRL 7.0 (GLOVE) ×2 IMPLANT
GLOVE SURG ORTHO 8.0 STRL STRW (GLOVE) ×2 IMPLANT
GOWN STRL REUS W/TWL LRG LVL3 (GOWN DISPOSABLE) ×4 IMPLANT
KIT ABG SYR 3ML LUER SLIP (SYRINGE) ×2 IMPLANT
NDL HYPO 25X5/8 SAFETYGLIDE (NEEDLE) ×2 IMPLANT
NEEDLE HYPO 25X5/8 SAFETYGLIDE (NEEDLE) ×1 IMPLANT
NS IRRIG 1000ML POUR BTL (IV SOLUTION) ×2 IMPLANT
PACK C SECTION WH (CUSTOM PROCEDURE TRAY) ×2 IMPLANT
PAD ABD 7.5X8 STRL (GAUZE/BANDAGES/DRESSINGS) IMPLANT
PAD OB MATERNITY 4.3X12.25 (PERSONAL CARE ITEMS) ×2 IMPLANT
STRIP CLOSURE SKIN 1/2X4 (GAUZE/BANDAGES/DRESSINGS) IMPLANT
SUT MNCRL 0 VIOLET CTX 36 (SUTURE) ×6 IMPLANT
SUT MON AB 4-0 PS1 27 (SUTURE) ×2 IMPLANT
SUT MONOCRYL 0 CTX 36 (SUTURE) ×3
SUT PDS AB 1 CT  36 (SUTURE)
SUT PDS AB 1 CT 36 (SUTURE) IMPLANT
SUT VIC AB 1 CTX 36 (SUTURE)
SUT VIC AB 1 CTX36XBRD ANBCTRL (SUTURE) IMPLANT
TOWEL OR 17X24 6PK STRL BLUE (TOWEL DISPOSABLE) ×2 IMPLANT
TRAY FOLEY W/BAG SLVR 14FR LF (SET/KITS/TRAYS/PACK) ×2 IMPLANT
WATER STERILE IRR 1000ML POUR (IV SOLUTION) ×2 IMPLANT

## 2022-06-06 NOTE — Anesthesia Preprocedure Evaluation (Addendum)
Anesthesia Evaluation  Patient identified by MRN, date of birth, ID band Patient awake    Reviewed: Allergy & Precautions, NPO status , Patient's Chart, lab work & pertinent test results  Airway Mallampati: IV  TM Distance: >3 FB Neck ROM: Full    Dental no notable dental hx.    Pulmonary asthma    Pulmonary exam normal breath sounds clear to auscultation       Cardiovascular hypertension, Pt. on medications Normal cardiovascular exam Rhythm:Regular Rate:Normal     Neuro/Psych negative neurological ROS  negative psych ROS   GI/Hepatic Neg liver ROS,GERD  Medicated and Controlled,,  Endo/Other    Morbid obesityBMI 45  Renal/GU negative Renal ROS  negative genitourinary   Musculoskeletal negative musculoskeletal ROS (+)    Abdominal  (+) + obese  Peds negative pediatric ROS (+)  Hematology negative hematology ROS (+) Hb 13.2, plt 269   Anesthesia Other Findings   Reproductive/Obstetrics (+) Pregnancy IOL for gHTN, failed induction                             Anesthesia Physical Anesthesia Plan  ASA: 3  Anesthesia Plan: Spinal   Post-op Pain Management: Regional block*, Ofirmev IV (intra-op)* and Toradol IV (intra-op)*   Induction:   PONV Risk Score and Plan: 2 and Ondansetron, Dexamethasone and Treatment may vary due to age or medical condition  Airway Management Planned: Natural Airway  Additional Equipment: None  Intra-op Plan:   Post-operative Plan:   Informed Consent: I have reviewed the patients History and Physical, chart, labs and discussed the procedure including the risks, benefits and alternatives for the proposed anesthesia with the patient or authorized representative who has indicated his/her understanding and acceptance.       Plan Discussed with: CRNA  Anesthesia Plan Comments:        Anesthesia Quick Evaluation

## 2022-06-06 NOTE — Op Note (Signed)
Cesarean Section Procedure Note  Pre-operative Diagnosis: IUP at 37 weeks, Gestational HTN, failed induction  Post-operative Diagnosis: same  Surgeon: Luz Lex   Assistants: Dr Clement Husbands An experienced assistant was required given the standard of surgical care given the complexity of the case.  This assistant was needed for exposure, dissection, suctioning, retraction, instrument exchange, assisting with delivery with administration of fundal pressure, and for overall help during the procedure  Anesthesia: spinal  Procedure:  Low Segment Transverse cesarean section  Procedure Details  The patient was seen in the Holding Room. The risks, benefits, complications, treatment options, and expected outcomes were discussed with the patient.  The patient concurred with the proposed plan, giving informed consent.  The site of surgery properly noted/marked.. A Time Out was held and the above information confirmed.  After induction of anesthesia, the patient was draped and prepped in the usual sterile manner. A Pfannenstiel incision was made and carried down through the subcutaneous tissue to the fascia. Fascial incision was made and extended transversely. The fascia was separated from the underlying rectus tissue superiorly and inferiorly. The peritoneum was identified and entered. Peritoneal incision was extended longitudinally. The utero-vesical peritoneal reflection was incised transversely and the bladder flap was bluntly freed from the lower uterine segment. A low transverse uterine incision was made. Delivered from vertex presentation was a baby with Apgar scores of 9 at one minute and 9 at five minutes. After the umbilical cord was clamped and cut cord blood was obtained for evaluation. The placenta was removed intact and appeared normal. The uterine outline, tubes and ovaries appeared normal. The uterine incision was closed with running locked sutures of 0 monocryl and imbricated with 0  monocryl. Hemostasis was observed. Lavage was carried out until clear. The peritoneum was then closed with 0 monocryl and rectus muscles plicated in the midline.  After hemostasis was assured, the fascia was then reapproximated with running sutures of 0 PDS. Irrigation was applied and after adequate hemostasis was assured, the skin was reapproximated with subcutaneous sutures using 4-0 monocryl.  Instrument, sponge, and needle counts were correct prior the abdominal closure and at the conclusion of the case. The patient received 2 grams cefotetan preoperatively.  Findings: Viable female  Estimated Blood Loss:  144cc         Specimens: Placenta was sent to labor and delivery         Complications:  None

## 2022-06-06 NOTE — H&P (Signed)
Danielle Padilla is a 27 y.o. female presenting for IOL due to gestational htn on procardia.  No PIH sxs but elevated BPs and close evaluation and at [facility] for GHTN.  GBS negative. OB History     Gravida  1   Para  0   Term  0   Preterm  0   AB  0   Living  0      SAB  0   IAB  0   Ectopic  0   Multiple  0   Live Births  0          Past Medical History:  Diagnosis Date   Asthma    PCOS (polycystic ovarian syndrome)    Pregnancy induced hypertension    Past Surgical History:  Procedure Laterality Date   EYE SURGERY     Family History: family history includes Asthma in her brother and father; Cancer in her maternal grandmother and maternal uncle; Diabetes in her mother and paternal grandmother; Hypertension in her father. Social History:  reports that she has never smoked. She has never used smokeless tobacco. She reports that she does not drink alcohol and does not use drugs.     Maternal Diabetes: No Genetic Screening: Normal Maternal Ultrasounds/Referrals: Normal Fetal Ultrasounds or other Referrals:  None Maternal Substance Abuse:  No Significant Maternal Medications:  Procardia Significant Maternal Lab Results:  Group B Strep negative Number of Prenatal Visits:greater than 3 verified prenatal visits Other Comments:  None  Review of Systems History Dilation: Closed Effacement (%): 30 Station: -3 Exam by:: Lupita Shutter, RN Blood pressure (!) 143/105, pulse 96, temperature 98.2 F (36.8 C), temperature source Oral, resp. rate 18, height 5\' 7"  (1.702 m), weight 130.5 kg, SpO2 100 %. Exam Physical Exam Vitals and nursing note reviewed. Exam conducted with a chaperone present.  Constitutional:      Appearance: Normal appearance.  HENT:     Head: Normocephalic.  Eyes:     Pupils: Pupils are equal, round, and reactive to light.  Cardiovascular:     Rate and Rhythm: Normal rate and regular rhythm.     Pulses: Normal pulses.  Abdominal:      General: Abdomen is Gravid, nontender Neurological:     Mental Status: She is alert. DTRs 2/4 without clonus  Prenatal labs: ABO, Rh: --/--/O POS (01/19 0020) Antibody: NEG (01/19 0020) Rubella: Immune (06/30 0000) RPR: Nonreactive (06/30 0000)  HBsAg: Negative (06/30 0000)  HIV: Non-reactive (06/30 0000)  GBS: Negative/-- (01/11 0000)   Assessment/Plan: IUP at term Gestational HTN without sxs and normal labs.  Cont procardia and labetalol protocol for elevated BPs.  If persistent severe range BPs, would begin  Magnesium S/P cytotec x 2 with minimal change.  Discussed options and will continue with Buccal 38mcg q 4 misoprostol for now  Luz Lex 06/06/2022, 8:39 AM

## 2022-06-06 NOTE — Progress Notes (Signed)
Danielle Padilla is a 27 y.o. G1P0000 at [redacted]w[redacted]d by LMP admitted for induction of labor due to Hypertension.  Subjective:   Objective: BP (!) 140/96   Pulse 79   Temp 97.8 F (36.6 C) (Oral)   Resp 18   Ht 5\' 7"  (1.702 m)   Wt 130.5 kg   SpO2 99%   BMI 45.04 kg/m  No intake/output data recorded. No intake/output data recorded.  FHT:  FHR: 140 bpm, variability: moderate,  accelerations:  Present,  decelerations:  Absent UC:   none SVE:   Dilation: Fingertip Effacement (%): Thick Station: NVR [employer] Exam by:: Roselle Locus RN  Labs: Lab Results  Component Value Date   WBC 8.7 06/06/2022   HGB 13.2 06/06/2022   HCT 36.2 06/06/2022   MCV 85.2 06/06/2022   PLT 269 06/06/2022    Assessment / Plan: No change after Cytotec 4mcg q 4 hours since midnight  Labor:    Preeclampsia:  no signs or symptoms of toxicity and labs stable Fetal Wellbeing:  Category I Pain Control:     I/D:  n/a Anticipated MOD:   The patient is worried and frustrated about the lack of progress.  We discussed the options of cytotec, foley bulb, pitocin. Currently she is stable and the baby monitoring is reassuring. She requests a c/s for failed induction Risks and benefits of C/S were discussed.  All questions were answered and informed consent was obtained.  Plan to proceed with low segment transverse Cesarean Section.  Luz Lex, MD 06/06/2022, 6:48 PM

## 2022-06-06 NOTE — Anesthesia Procedure Notes (Signed)
Spinal  Patient location during procedure: OR Start time: 06/06/2022 8:02 PM End time: 06/06/2022 8:07 PM Reason for block: surgical anesthesia Staffing Performed: anesthesiologist  Anesthesiologist: Pervis Hocking, DO Performed by: Pervis Hocking, DO Authorized by: Pervis Hocking, DO   Preanesthetic Checklist Completed: patient identified, IV checked, risks and benefits discussed, surgical consent, monitors and equipment checked, pre-op evaluation and timeout performed Spinal Block Patient position: sitting Prep: DuraPrep and site prepped and draped Patient monitoring: cardiac monitor, continuous pulse ox and blood pressure Approach: midline Location: L3-4 Injection technique: single-shot Needle Needle type: Pencan  Needle gauge: 24 G Needle length: 9 cm Assessment Sensory level: T6 Events: CSF return Additional Notes Functioning IV was confirmed and monitors were applied. Sterile prep and drape, including hand hygiene and sterile gloves were used. The patient was positioned and the spine was prepped. The skin was anesthetized with lidocaine.  Free flow of clear CSF was obtained prior to injecting local anesthetic into the CSF.  The spinal needle aspirated freely following injection.  The needle was carefully withdrawn.  The patient tolerated the procedure well.   Performed by srna under direct supervision

## 2022-06-06 NOTE — Transfer of Care (Cosign Needed)
Immediate Anesthesia Transfer of Care Note  Patient: Danielle Padilla  Procedure(s) Performed: CESAREAN SECTION  Patient Location: PACU  Anesthesia Type:Spinal  Level of Consciousness: awake, alert , oriented, and patient cooperative  Airway & Oxygen Therapy: Patient Spontanous Breathing  Post-op Assessment: Report given to RN and Post -op Vital signs reviewed and stable  Post vital signs: Reviewed and stable  Last Vitals:  Vitals Value Taken Time  BP 121/98 06/06/22 2126  Temp    Pulse 92 06/06/22 2129  Resp 13 06/06/22 2129  SpO2 96 % 06/06/22 2129  Vitals shown include unvalidated device data.  Last Pain:  Vitals:   06/06/22 1800  TempSrc:   PainSc: 0-No pain      Patients Stated Pain Goal: 0 (20/25/42 7062)  Complications: No notable events documented.

## 2022-06-07 ENCOUNTER — Encounter (HOSPITAL_COMMUNITY): Payer: Self-pay | Admitting: Obstetrics and Gynecology

## 2022-06-07 LAB — CBC
HCT: 32.3 % — ABNORMAL LOW (ref 36.0–46.0)
Hemoglobin: 11.8 g/dL — ABNORMAL LOW (ref 12.0–15.0)
MCH: 31.3 pg (ref 26.0–34.0)
MCHC: 36.5 g/dL — ABNORMAL HIGH (ref 30.0–36.0)
MCV: 85.7 fL (ref 80.0–100.0)
Platelets: 278 10*3/uL (ref 150–400)
RBC: 3.77 MIL/uL — ABNORMAL LOW (ref 3.87–5.11)
RDW: 12.6 % (ref 11.5–15.5)
WBC: 14.9 10*3/uL — ABNORMAL HIGH (ref 4.0–10.5)
nRBC: 0 % (ref 0.0–0.2)

## 2022-06-07 MED ORDER — PRENATAL MULTIVITAMIN CH
1.0000 | ORAL_TABLET | Freq: Every day | ORAL | Status: DC
  Administered 2022-06-07 – 2022-06-08 (×2): 1 via ORAL
  Filled 2022-06-07 (×2): qty 1

## 2022-06-07 MED ORDER — OXYTOCIN-SODIUM CHLORIDE 30-0.9 UT/500ML-% IV SOLN
2.5000 [IU]/h | INTRAVENOUS | Status: AC
  Administered 2022-06-07: 2.5 [IU]/h via INTRAVENOUS
  Filled 2022-06-07: qty 500

## 2022-06-07 MED ORDER — SIMETHICONE 80 MG PO CHEW: 80.0000 mg | CHEWABLE_TABLET | ORAL | Status: DC | PRN

## 2022-06-07 MED ORDER — SIMETHICONE 80 MG PO CHEW
80.0000 mg | CHEWABLE_TABLET | Freq: Three times a day (TID) | ORAL | Status: DC
  Administered 2022-06-07 – 2022-06-08 (×4): 80 mg via ORAL
  Filled 2022-06-07 (×4): qty 1

## 2022-06-07 MED ORDER — MEASLES, MUMPS & RUBELLA VAC IJ SOLR: 0.5000 mL | Freq: Once | INTRAMUSCULAR | Status: DC

## 2022-06-07 MED ORDER — IBUPROFEN 600 MG PO TABS
600.0000 mg | ORAL_TABLET | Freq: Four times a day (QID) | ORAL | Status: DC
  Administered 2022-06-07 – 2022-06-08 (×6): 600 mg via ORAL
  Filled 2022-06-07 (×6): qty 1

## 2022-06-07 MED ORDER — TETANUS-DIPHTH-ACELL PERTUSSIS 5-2.5-18.5 LF-MCG/0.5 IM SUSY: 0.5000 mL | PREFILLED_SYRINGE | Freq: Once | INTRAMUSCULAR | Status: DC

## 2022-06-07 MED ORDER — OXYCODONE-ACETAMINOPHEN 5-325 MG PO TABS: 1.0000 | ORAL_TABLET | ORAL | Status: DC | PRN

## 2022-06-07 MED ORDER — ZOLPIDEM TARTRATE 5 MG PO TABS: 5.0000 mg | ORAL_TABLET | Freq: Every evening | ORAL | Status: DC | PRN

## 2022-06-07 MED ORDER — NIFEDIPINE ER OSMOTIC RELEASE 30 MG PO TB24: 30.0000 mg | ORAL_TABLET | Freq: Every day | ORAL | Status: DC

## 2022-06-07 MED ORDER — NIFEDIPINE ER OSMOTIC RELEASE 30 MG PO TB24
30.0000 mg | ORAL_TABLET | Freq: Two times a day (BID) | ORAL | Status: DC
  Administered 2022-06-07: 30 mg via ORAL
  Filled 2022-06-07: qty 1

## 2022-06-07 MED ORDER — MENTHOL 3 MG MT LOZG: 1.0000 | LOZENGE | OROMUCOSAL | Status: DC | PRN

## 2022-06-07 MED ORDER — SENNOSIDES-DOCUSATE SODIUM 8.6-50 MG PO TABS
2.0000 | ORAL_TABLET | ORAL | Status: DC
  Administered 2022-06-07 – 2022-06-08 (×2): 2 via ORAL
  Filled 2022-06-07 (×2): qty 2

## 2022-06-07 MED ORDER — WITCH HAZEL-GLYCERIN EX PADS: 1.0000 | MEDICATED_PAD | CUTANEOUS | Status: DC | PRN

## 2022-06-07 MED ORDER — COCONUT OIL OIL: 1.0000 | TOPICAL_OIL | Status: DC | PRN

## 2022-06-07 MED ORDER — DIBUCAINE (PERIANAL) 1 % EX OINT: 1.0000 | TOPICAL_OINTMENT | CUTANEOUS | Status: DC | PRN

## 2022-06-07 MED ORDER — SODIUM CHLORIDE 0.9 % IV SOLN
500.0000 mg | Freq: Once | INTRAVENOUS | Status: AC
  Administered 2022-06-07: 500 mg via INTRAVENOUS
  Filled 2022-06-07: qty 25
  Filled 2022-06-07: qty 500

## 2022-06-07 MED ORDER — DIPHENHYDRAMINE HCL 25 MG PO CAPS: 25.0000 mg | ORAL_CAPSULE | Freq: Four times a day (QID) | ORAL | Status: DC | PRN

## 2022-06-07 MED ORDER — LABETALOL HCL 200 MG PO TABS
200.0000 mg | ORAL_TABLET | Freq: Two times a day (BID) | ORAL | Status: DC
  Administered 2022-06-07 – 2022-06-08 (×2): 200 mg via ORAL
  Filled 2022-06-07 (×3): qty 1

## 2022-06-07 MED ORDER — LACTATED RINGERS IV SOLN: INTRAVENOUS | Status: DC

## 2022-06-07 MED ORDER — ACETAMINOPHEN 325 MG PO TABS: 650.0000 mg | ORAL_TABLET | ORAL | Status: DC | PRN

## 2022-06-07 NOTE — Anesthesia Postprocedure Evaluation (Signed)
Anesthesia Post Note  Patient: Danielle Padilla  Procedure(s) Performed: [place]     Patient location during evaluation: PACU Anesthesia Type: Spinal Level of consciousness: awake and alert and oriented Pain management: pain level controlled Vital Signs Assessment: post-procedure vital signs reviewed and stable Respiratory status: spontaneous breathing, nonlabored ventilation and respiratory function stable Cardiovascular status: blood pressure returned to baseline and stable Postop Assessment: no headache, no backache, spinal receding and patient able to bend at knees Anesthetic complications: no   No notable events documented.  Last Vitals:  Vitals:   06/06/22 2343 06/07/22 0042  BP: (!) 134/100 133/88  Pulse: 99 (!) 101  Resp: 16 16  Temp: 37.1 C 36.8 C  SpO2: 97% 97%    Last Pain:  Vitals:   06/07/22 0042  TempSrc: Oral  PainSc: 0-No pain   Pain Goal: Patients Stated Pain Goal: 0 (06/06/22 1351)                 Pervis Hocking

## 2022-06-07 NOTE — Progress Notes (Signed)
Subjective:1 Postpartum Day 1: Cesarean Delivery Patient reports tolerating PO.    Objective: Vital signs in last 24 hours: Temp:  [97.8 F (36.6 C)-98.8 F (37.1 C)] 98.1 F (36.7 C) (01/20 0345) Pulse Rate:  [75-107] 96 (01/20 0503) Resp:  [14-24] 16 (01/20 0503) BP: (119-147)/(82-114) 130/103 (01/20 0503) SpO2:  [96 %-100 %] 100 % (01/20 0503)  Physical Exam:  General: alert, cooperative, appears stated age, and no distress Lochia: appropriate Uterine Fundus: firm Incision: bandage dry DVT Evaluation: No evidence of DVT seen on physical exam.  Recent Labs    06/06/22 0018 06/07/22 0107  HGB 13.2 11.8*  HCT 36.2 32.3*    Assessment/Plan: Status post Cesarean section. Postoperative course complicated by [facility] with elevated dyastolic bp.  Will change from procardia to labetalol   No PIH sxs.  Luz Lex, MD 06/07/2022, 9:02 AM

## 2022-06-07 NOTE — Lactation Note (Signed)
This note was copied from a baby's chart. Lactation Consultation Note  Patient Name: Danielle Padilla [id number] Date: 06/07/2022 Reason for consult: Initial assessment;Primapara;1st time breastfeeding;Infant < 6lbs;Early term 37-38.6wks;Breastfeeding assistance Age:26 hours   P1: Early term infant at 37+0 weeks weighing < 6 lbs Feeding preference: Breast/formula Weight loss: 0%  "[name]" was swaddled and asleep in the bassinet when I arrived.  Mother reported that it had been more than 3 hours since her last feeding.  Offered to assist with waking and latching; mother receptive.  Reviewed hand expression; no drops obtained.  Assisted to latch after a few attempts, however, "[name]" was not interested in initiating a suck.  Demonstrated breast compressions and gentle stimulaion; few sucks only.  Suggested father supplement with formula.  Due to gestational age and size encouraged mother to feed at least every three hours or sooner if baby shows cues.  Suggested she call her RN/LC for latch assistance as needed.  Parents are using the purple extra slow flow nipple without difficulty; reminded to burp frequently during the feeding and again at the end of the feeding.  Suggested mother may want to initiate the electric pump if baby is not latching/feeding as the day progresses.  Mother verbalized understanding.  Father present and assisting with newborn care.  Provided the manual pump with a #21 flange.  Advised mother to pre-pump prior to latching and prn throughout the day.  She will feed back any expressed drops to "[name]."   Maternal Data Has patient been taught Hand Expression?: Yes Does the patient have breastfeeding experience prior to this delivery?: No  Feeding Mother's Current Feeding Choice: Breast Milk and Formula Nipple Type: Extra Slow Flow  LATCH Score Latch: Repeated attempts needed to sustain latch, nipple held in mouth throughout feeding, stimulation needed to  elicit sucking reflex.  Audible Swallowing: None  Type of Nipple: Everted at rest and after stimulation (short shafted)  Comfort (Breast/Nipple): Soft / non-tender  Hold (Positioning): Assistance needed to correctly position infant at breast and maintain latch.  LATCH Score: 6   Lactation Tools Discussed/Used Tools: Pump;Flanges Flange Size: 21 Breast pump type: Manual Pump Education: Setup, frequency, and cleaning;Milk Storage Reason for Pumping: Breast stimulation and nipple eversion Pumping frequency: Prior to feeding and prn  Interventions Interventions: Breast feeding basics reviewed;Assisted with latch;Skin to skin;Breast massage;Hand express;Pre-pump if needed;Breast compression;Hand pump;Position options;Support pillows;Adjust position;Education;LC Services brochure  Discharge Pump: Personal  Consult Status Consult Status: Follow-up Date: 06/08/22 Follow-up type: In-patient    [name] 06/07/2022, 9:44 AM

## 2022-06-07 NOTE — Plan of Care (Signed)
Problem: Education: Goal: Knowledge of disease or condition will improve Outcome: Progressing Goal: Knowledge of the prescribed therapeutic regimen will improve Outcome: Progressing Goal: Individualized Educational Video(s) Outcome: Progressing   Problem: Clinical Measurements: Goal: Complications related to the disease process, condition or treatment will be avoided or minimized Outcome: Progressing   Problem: Education: Goal: Knowledge of Childbirth will improve Outcome: Progressing Goal: Ability to make informed decisions regarding treatment and plan of care will improve Outcome: Progressing Goal: Ability to state and carry out methods to decrease the pain will improve Outcome: Progressing Goal: Individualized Educational Video(s) Outcome: Progressing   Problem: Coping: Goal: Ability to verbalize concerns and feelings about labor and delivery will improve Outcome: Progressing   Problem: Life Cycle: Goal: Ability to make normal progression through stages of labor will improve Outcome: Progressing Goal: Ability to effectively push during vaginal delivery will improve Outcome: Progressing   Problem: Role Relationship: Goal: Will demonstrate positive interactions with the child Outcome: Progressing   Problem: Safety: Goal: Risk of complications during labor and delivery will decrease Outcome: Progressing   Problem: Pain Management: Goal: Relief or control of pain from uterine contractions will improve Outcome: Progressing   Problem: Education: Goal: Knowledge of General Education information will improve Description: Including pain rating scale, medication(s)/side effects and non-pharmacologic comfort measures Outcome: Progressing   Problem: Health Behavior/Discharge Planning: Goal: Ability to manage health-related needs will improve Outcome: Progressing   Problem: Clinical Measurements: Goal: Ability to maintain clinical measurements within normal limits will  improve Outcome: Progressing Goal: Will remain free from infection Outcome: Progressing Goal: Diagnostic test results will improve Outcome: Progressing Goal: Respiratory complications will improve Outcome: Progressing Goal: Cardiovascular complication will be avoided Outcome: Progressing   Problem: Activity: Goal: Risk for activity intolerance will decrease Outcome: Progressing   Problem: Nutrition: Goal: Adequate nutrition will be maintained Outcome: Progressing   Problem: Coping: Goal: Level of anxiety will decrease Outcome: Progressing   Problem: Elimination: Goal: Will not experience complications related to bowel motility Outcome: Progressing Goal: Will not experience complications related to urinary retention Outcome: Progressing   Problem: Pain Managment: Goal: General experience of comfort will improve Outcome: Progressing   Problem: Safety: Goal: Ability to remain free from injury will improve Outcome: Progressing   Problem: Skin Integrity: Goal: Risk for impaired skin integrity will decrease Outcome: Progressing   Problem: Education: Goal: Knowledge of disease or condition will improve Outcome: Progressing Goal: Knowledge of the prescribed therapeutic regimen will improve Outcome: Progressing   Problem: Fluid Volume: Goal: Peripheral tissue perfusion will improve Outcome: Progressing   Problem: Clinical Measurements: Goal: Complications related to disease process, condition or treatment will be avoided or minimized Outcome: Progressing   Problem: Education: Goal: Knowledge of the prescribed therapeutic regimen will improve Outcome: Progressing Goal: Understanding of sexual limitations or changes related to disease process or condition will improve Outcome: Progressing Goal: Individualized Educational Video(s) Outcome: Progressing   Problem: Self-Concept: Goal: Communication of feelings regarding changes in body function or appearance will  improve Outcome: Progressing   Problem: Skin Integrity: Goal: Demonstration of wound healing without infection will improve Outcome: Progressing   Problem: Education: Goal: Knowledge of condition will improve Outcome: Progressing Goal: Individualized Educational Video(s) Outcome: Progressing Goal: Individualized Newborn Educational Video(s) Outcome: Progressing   Problem: Activity: Goal: Will verbalize the importance of balancing activity with adequate rest periods Outcome: Progressing Goal: Ability to tolerate increased activity will improve Outcome: Progressing   Problem: Coping: Goal: Ability to identify and utilize available resources and services will improve  Outcome: Progressing   Problem: Life Cycle: Goal: Chance of risk for complications during the postpartum period will decrease Outcome: Progressing   Problem: Role Relationship: Goal: Ability to demonstrate positive interaction with newborn will improve Outcome: Progressing

## 2022-06-08 MED ORDER — LABETALOL HCL 200 MG PO TABS: 200.0000 mg | ORAL_TABLET | Freq: Two times a day (BID) | ORAL | 1 refills | Status: DC

## 2022-06-08 MED ORDER — OXYCODONE-ACETAMINOPHEN 5-325 MG PO TABS: 1.0000 | ORAL_TABLET | ORAL | 0 refills | Status: DC | PRN

## 2022-06-08 MED ORDER — IBUPROFEN 600 MG PO TABS: 600.0000 mg | ORAL_TABLET | Freq: Four times a day (QID) | ORAL | 0 refills | Status: DC | PRN

## 2022-06-08 NOTE — Progress Notes (Signed)
Subjective: Postpartum Day 2: Cesarean Delivery Patient reports tolerating PO, + flatus, and no problems voiding.    Objective: Vital signs in last 24 hours: Temp:  [98.2 F (36.8 C)-98.6 F (37 C)] 98.2 F (36.8 C) (01/20 1645) Pulse Rate:  [85-102] 89 (01/20 2029) Resp:  [18] 18 (01/20 2029) BP: (104-126)/(61-87) 123/85 (01/21 0500) SpO2:  [100 %] 100 % (01/20 2029)  Physical Exam:  General: alert, cooperative, appears stated age, and no distress Lochia: appropriate Uterine Fundus: firm Incision: healing well DVT Evaluation: No evidence of DVT seen on physical exam.  Recent Labs    06/06/22 0018 06/07/22 0107  HGB 13.2 11.8*  HCT 36.2 32.3*    Assessment/Plan: Status post Cesarean section. Doing well postoperatively. BP well controlled now on labetalol.     Discharge home with standard precautions and return to clinic in 4-6 weeks BP this week.  Luz Lex, MD 06/08/2022, 9:40 AM

## 2022-06-08 NOTE — Discharge Summary (Signed)
Postpartum Discharge Summary       Patient Name: Danielle Padilla DOB: Oct 05, 1995 MRN: [id number]  Date of admission: 06/06/2022 Delivery date:06/06/2022  Delivering provider: Louretta Shorten  Date of discharge: 06/08/2022  Admitting diagnosis: Gestational hypertension, third trimester [O13.3] Intrauterine pregnancy: [redacted]w[redacted]d     Secondary diagnosis:  Principal Problem:   Gestational hypertension, third trimester  Additional problems:      Discharge diagnosis: Term Pregnancy Delivered, Gestational Hypertension, and failed induction                                               Post partum procedures:   Augmentation: Cytotec Complications: None  Hospital course: Induction of Labor With Cesarean Section   27 y.o. yo G1P1001 at [redacted]w[redacted]d was admitted to the hospital 06/06/2022 for induction of labor. Patient had a labor course significant for GHTN and minimal response to induction meds. The patient went for cesarean section due to Elective Primary and failed/prolonged induction . Delivery details are as follows: Membrane Rupture Time/Date: 8:36 PM ,06/06/2022   Delivery Method:C-Section, Low Transverse  Details of operation can be found in separate operative Note.  Patient had a postpartum course complicated by elevated BPs which responded to medication change. She is ambulating, tolerating a regular diet, passing flatus, and urinating well.  Patient is discharged home in stable condition on 06/08/22.      Newborn Data: Birth date:06/06/2022  Birth time:8:37 PM  Gender:Female  Living status:Living  Apgars:8 ,9  [phone] g                                Magnesium Sulfate received: No BMZ received: No Rhophylac:N/A MMR: NA T-DaP:Given prenatally Flu: No Transfusion:No  Physical exam  Vitals:   06/07/22 1645 06/07/22 2029 06/07/22 2324 06/08/22 0500  BP: 122/80 126/87 104/61 123/85  Pulse: 85 89    Resp: 18 18    Temp: 98.2 F (36.8 C)     TempSrc: Oral      SpO2: 100% 100%    Weight:      Height:       General: alert, cooperative, and no distress Lochia: appropriate Uterine Fundus: firm Incision: Healing well with no significant drainage DVT Evaluation: No evidence of DVT seen on physical exam. Labs: Lab Results  Component Value Date   WBC 14.9 (H) 06/07/2022   HGB 11.8 (L) 06/07/2022   HCT 32.3 (L) 06/07/2022   MCV 85.7 06/07/2022   PLT 278 06/07/2022      Latest Ref Rng & Units 06/06/2022   12:18 AM  CMP  Glucose 70 - 99 mg/dL 107   BUN 6 - 20 mg/dL 14   Creatinine 0.44 - 1.00 mg/dL 0.89   Sodium 135 - 145 mmol/L 135   Potassium 3.5 - 5.1 mmol/L 3.9   Chloride 98 - 111 mmol/L 104   CO2 22 - 32 mmol/L 23   Calcium 8.9 - 10.3 mg/dL 9.2   Total Protein 6.5 - 8.1 g/dL 6.1   Total Bilirubin 0.3 - 1.2 mg/dL 0.3   Alkaline Phos 38 - 126 U/L 107   AST 15 - 41 U/L 26   ALT 0 - 44 U/L 24    Edinburgh Score:    06/07/2022   12:45 PM  Edinburgh Postnatal Depression  Scale Screening Tool  I have been able to laugh and see the funny side of things. 0  I have looked forward with enjoyment to things. 0  I have blamed myself unnecessarily when things went wrong. 0  I have been anxious or worried for no good reason. 1  I have felt scared or panicky for no good reason. 0  Things have been getting on top of me. 0  I have been so unhappy that I have had difficulty sleeping. 0  I have felt sad or miserable. 0  I have been so unhappy that I have been crying. 0  The thought of harming myself has occurred to me. 0  Edinburgh Postnatal Depression Scale Total 1      After visit meds:  Allergies as of 06/08/2022   No Known Allergies      Medication List     STOP taking these medications    NIFEdipine 30 MG 24 hr tablet Commonly known as: PROCARDIA-XL/NIFEDICAL-XL   omeprazole 20 MG capsule Commonly known as: PRILOSEC       TAKE these medications    ibuprofen 600 MG tablet Commonly known as: ADVIL Take 1 tablet (600 mg  total) by mouth every 6 (six) hours as needed for cramping, moderate pain or mild pain.   labetalol 200 MG tablet Commonly known as: NORMODYNE Take 1 tablet (200 mg total) by mouth 2 (two) times daily.   oxyCODONE-acetaminophen 5-325 MG tablet Commonly known as: PERCOCET/ROXICET Take 1-2 tablets by mouth every 4 (four) hours as needed for moderate pain or severe pain.   prenatal multivitamin Tabs tablet Take 1 tablet by mouth daily at 12 noon.         Discharge home in stable condition Infant Feeding: Breast Infant Disposition:home with mother Discharge instruction: per After Visit Summary and Postpartum booklet. Activity: Advance as tolerated. Pelvic rest for 6 weeks.  Diet: routine diet Anticipated Birth Control: Unsure Postpartum Appointment:6 weeks Additional Postpartum F/U: BP check 1 week Future Appointments:No future appointments. Follow up Visit:      06/08/2022 Luz Lex, MD

## 2022-06-08 NOTE — Plan of Care (Signed)
Problem: Education: Goal: Knowledge of disease or condition will improve 06/08/2022 1137 by Rodolph Bong, LPN Outcome: Adequate for Discharge 06/08/2022 0750 by Rodolph Bong, LPN Outcome: Progressing Goal: Knowledge of the prescribed therapeutic regimen will improve 06/08/2022 1137 by Rodolph Bong, LPN Outcome: Adequate for Discharge 06/08/2022 0750 by Rodolph Bong, LPN Outcome: Progressing Goal: Individualized Educational Video(s) 06/08/2022 1137 by Rodolph Bong, LPN Outcome: Adequate for Discharge 06/08/2022 0750 by Rodolph Bong, LPN Outcome: Progressing   Problem: Clinical Measurements: Goal: Complications related to the disease process, condition or treatment will be avoided or minimized 06/08/2022 1137 by Rodolph Bong, LPN Outcome: Adequate for Discharge 06/08/2022 0750 by Rodolph Bong, LPN Outcome: Progressing   Problem: Education: Goal: Knowledge of Childbirth will improve 06/08/2022 1137 by Rodolph Bong, LPN Outcome: Adequate for Discharge 06/08/2022 0750 by Rodolph Bong, LPN Outcome: Progressing Goal: Ability to make informed decisions regarding treatment and plan of care will improve 06/08/2022 1137 by Rodolph Bong, LPN Outcome: Adequate for Discharge 06/08/2022 0750 by Rodolph Bong, LPN Outcome: Progressing Goal: Ability to state and carry out methods to decrease the pain will improve 06/08/2022 1137 by Rodolph Bong, LPN Outcome: Adequate for Discharge 06/08/2022 0750 by Rodolph Bong, LPN Outcome: Progressing Goal: Individualized Educational Video(s) 06/08/2022 1137 by Rodolph Bong, LPN Outcome: Adequate for Discharge 06/08/2022 0750 by Rodolph Bong, LPN Outcome: Progressing   Problem: Coping: Goal: Ability to verbalize concerns and feelings about labor and delivery will improve 06/08/2022 1137 by Rodolph Bong, LPN Outcome: Adequate for Discharge 06/08/2022 0750 by Rodolph Bong, LPN Outcome: Progressing   Problem: Life Cycle: Goal:  Ability to make normal progression through stages of labor will improve 06/08/2022 1137 by Rodolph Bong, LPN Outcome: Adequate for Discharge 06/08/2022 0750 by Rodolph Bong, LPN Outcome: Progressing Goal: Ability to effectively push during vaginal delivery will improve 06/08/2022 1137 by Rodolph Bong, LPN Outcome: Adequate for Discharge 06/08/2022 0750 by Rodolph Bong, LPN Outcome: Progressing   Problem: Role Relationship: Goal: Will demonstrate positive interactions with the child 06/08/2022 1137 by Rodolph Bong, LPN Outcome: Adequate for Discharge 06/08/2022 0750 by Rodolph Bong, LPN Outcome: Progressing   Problem: Safety: Goal: Risk of complications during labor and delivery will decrease 06/08/2022 1137 by Rodolph Bong, LPN Outcome: Adequate for Discharge 06/08/2022 0750 by Rodolph Bong, LPN Outcome: Progressing   Problem: Pain Management: Goal: Relief or control of pain from uterine contractions will improve 06/08/2022 1137 by Rodolph Bong, LPN Outcome: Adequate for Discharge 06/08/2022 0750 by Rodolph Bong, LPN Outcome: Progressing   Problem: Education: Goal: Knowledge of General Education information will improve Description: Including pain rating scale, medication(s)/side effects and non-pharmacologic comfort measures 06/08/2022 1137 by Rodolph Bong, LPN Outcome: Adequate for Discharge 06/08/2022 0750 by Rodolph Bong, LPN Outcome: Progressing   Problem: Health Behavior/Discharge Planning: Goal: Ability to manage health-related needs will improve 06/08/2022 1137 by Rodolph Bong, LPN Outcome: Adequate for Discharge 06/08/2022 0750 by Rodolph Bong, LPN Outcome: Progressing   Problem: Clinical Measurements: Goal: Ability to maintain clinical measurements within normal limits will improve 06/08/2022 1137 by Rodolph Bong, LPN Outcome: Adequate for Discharge 06/08/2022 0750 by Rodolph Bong, LPN Outcome: Progressing Goal: Will remain free from  infection 06/08/2022 1137 by Rodolph Bong, LPN Outcome: Adequate for Discharge 06/08/2022 0750 by Rodolph Bong, LPN Outcome: Progressing Goal: Diagnostic test results will improve 06/08/2022  1137 by Donne Hazel, LPN Outcome: Adequate for Discharge 06/08/2022 0750 by Donne Hazel, LPN Outcome: Progressing Goal: Respiratory complications will improve 06/08/2022 1137 by Donne Hazel, LPN Outcome: Adequate for Discharge 06/08/2022 0750 by Donne Hazel, LPN Outcome: Progressing Goal: Cardiovascular complication will be avoided 06/08/2022 1137 by Donne Hazel, LPN Outcome: Adequate for Discharge 06/08/2022 0750 by Donne Hazel, LPN Outcome: Progressing   Problem: Activity: Goal: Risk for activity intolerance will decrease 06/08/2022 1137 by Donne Hazel, LPN Outcome: Adequate for Discharge 06/08/2022 0750 by Donne Hazel, LPN Outcome: Progressing   Problem: Nutrition: Goal: Adequate nutrition will be maintained 06/08/2022 1137 by Donne Hazel, LPN Outcome: Adequate for Discharge 06/08/2022 0750 by Donne Hazel, LPN Outcome: Progressing   Problem: Coping: Goal: Level of anxiety will decrease 06/08/2022 1137 by Donne Hazel, LPN Outcome: Adequate for Discharge 06/08/2022 0750 by Donne Hazel, LPN Outcome: Progressing   Problem: Elimination: Goal: Will not experience complications related to bowel motility 06/08/2022 1137 by Donne Hazel, LPN Outcome: Adequate for Discharge 06/08/2022 0750 by Donne Hazel, LPN Outcome: Progressing Goal: Will not experience complications related to urinary retention 06/08/2022 1137 by Donne Hazel, LPN Outcome: Adequate for Discharge 06/08/2022 0750 by Donne Hazel, LPN Outcome: Progressing   Problem: Pain Managment: Goal: General experience of comfort will improve 06/08/2022 1137 by Donne Hazel, LPN Outcome: Adequate for Discharge 06/08/2022 0750 by Donne Hazel, LPN Outcome: Progressing   Problem:  Safety: Goal: Ability to remain free from injury will improve 06/08/2022 1137 by Donne Hazel, LPN Outcome: Adequate for Discharge 06/08/2022 0750 by Donne Hazel, LPN Outcome: Progressing   Problem: Skin Integrity: Goal: Risk for impaired skin integrity will decrease 06/08/2022 1137 by Donne Hazel, LPN Outcome: Adequate for Discharge 06/08/2022 0750 by Donne Hazel, LPN Outcome: Progressing   Problem: Education: Goal: Knowledge of disease or condition will improve 06/08/2022 1137 by Donne Hazel, LPN Outcome: Adequate for Discharge 06/08/2022 0750 by Donne Hazel, LPN Outcome: Progressing Goal: Knowledge of the prescribed therapeutic regimen will improve 06/08/2022 1137 by Donne Hazel, LPN Outcome: Adequate for Discharge 06/08/2022 0750 by Donne Hazel, LPN Outcome: Progressing   Problem: Fluid Volume: Goal: Peripheral tissue perfusion will improve 06/08/2022 1137 by Donne Hazel, LPN Outcome: Adequate for Discharge 06/08/2022 0750 by Donne Hazel, LPN Outcome: Progressing   Problem: Clinical Measurements: Goal: Complications related to disease process, condition or treatment will be avoided or minimized 06/08/2022 1137 by Donne Hazel, LPN Outcome: Adequate for Discharge 06/08/2022 0750 by Donne Hazel, LPN Outcome: Progressing   Problem: Education: Goal: Knowledge of the prescribed therapeutic regimen will improve 06/08/2022 1137 by Donne Hazel, LPN Outcome: Adequate for Discharge 06/08/2022 0750 by Donne Hazel, LPN Outcome: Progressing Goal: Understanding of sexual limitations or changes related to disease process or condition will improve 06/08/2022 1137 by Donne Hazel, LPN Outcome: Adequate for Discharge 06/08/2022 0750 by Donne Hazel, LPN Outcome: Progressing Goal: Individualized Educational Video(s) 06/08/2022 1137 by Donne Hazel, LPN Outcome: Adequate for Discharge 06/08/2022 0750 by Donne Hazel, LPN Outcome: Progressing    Problem: Self-Concept: Goal: Communication of feelings regarding changes in body function or appearance will improve 06/08/2022 1137 by Donne Hazel, LPN Outcome: Adequate for Discharge 06/08/2022 0750 by Donne Hazel, LPN Outcome: Progressing   Problem: Skin Integrity: Goal: Demonstration of wound healing without infection will improve 06/08/2022 1137 by  Rodolph Bong, LPN Outcome: Adequate for Discharge 06/08/2022 6629 by Rodolph Bong, LPN Outcome: Progressing   Problem: Education: Goal: Knowledge of condition will improve 06/08/2022 1137 by Rodolph Bong, LPN Outcome: Adequate for Discharge 06/08/2022 0750 by Rodolph Bong, LPN Outcome: Progressing Goal: Individualized Educational Video(s) 06/08/2022 1137 by Rodolph Bong, LPN Outcome: Adequate for Discharge 06/08/2022 0750 by Rodolph Bong, LPN Outcome: Progressing Goal: Individualized Newborn Educational Video(s) 06/08/2022 1137 by Rodolph Bong, LPN Outcome: Adequate for Discharge 06/08/2022 0750 by Rodolph Bong, LPN Outcome: Progressing   Problem: Activity: Goal: Will verbalize the importance of balancing activity with adequate rest periods 06/08/2022 1137 by Rodolph Bong, LPN Outcome: Adequate for Discharge 06/08/2022 0750 by Rodolph Bong, LPN Outcome: Progressing Goal: Ability to tolerate increased activity will improve 06/08/2022 1137 by Rodolph Bong, LPN Outcome: Adequate for Discharge 06/08/2022 0750 by Rodolph Bong, LPN Outcome: Progressing   Problem: Coping: Goal: Ability to identify and utilize available resources and services will improve 06/08/2022 1137 by Rodolph Bong, LPN Outcome: Adequate for Discharge 06/08/2022 0750 by Rodolph Bong, LPN Outcome: Progressing   Problem: Life Cycle: Goal: Chance of risk for complications during the postpartum period will decrease 06/08/2022 1137 by Rodolph Bong, LPN Outcome: Adequate for Discharge 06/08/2022 0750 by Rodolph Bong, LPN Outcome:  Progressing   Problem: Role Relationship: Goal: Ability to demonstrate positive interaction with newborn will improve 06/08/2022 1137 by Rodolph Bong, LPN Outcome: Adequate for Discharge 06/08/2022 0750 by Rodolph Bong, LPN Outcome: Progressing

## 2022-06-08 NOTE — Plan of Care (Signed)
Problem: Education: Goal: Knowledge of disease or condition will improve Outcome: Progressing Goal: Knowledge of the prescribed therapeutic regimen will improve Outcome: Progressing Goal: Individualized Educational Video(s) Outcome: Progressing   Problem: Clinical Measurements: Goal: Complications related to the disease process, condition or treatment will be avoided or minimized Outcome: Progressing   Problem: Education: Goal: Knowledge of Childbirth will improve Outcome: Progressing Goal: Ability to make informed decisions regarding treatment and plan of care will improve Outcome: Progressing Goal: Ability to state and carry out methods to decrease the pain will improve Outcome: Progressing Goal: Individualized Educational Video(s) Outcome: Progressing   Problem: Coping: Goal: Ability to verbalize concerns and feelings about labor and delivery will improve Outcome: Progressing   Problem: Life Cycle: Goal: Ability to make normal progression through stages of labor will improve Outcome: Progressing Goal: Ability to effectively push during vaginal delivery will improve Outcome: Progressing   Problem: Role Relationship: Goal: Will demonstrate positive interactions with the child Outcome: Progressing   Problem: Safety: Goal: Risk of complications during labor and delivery will decrease Outcome: Progressing   Problem: Pain Management: Goal: Relief or control of pain from uterine contractions will improve Outcome: Progressing   Problem: Education: Goal: Knowledge of General Education information will improve Description: Including pain rating scale, medication(s)/side effects and non-pharmacologic comfort measures Outcome: Progressing   Problem: Health Behavior/Discharge Planning: Goal: Ability to manage health-related needs will improve Outcome: Progressing   Problem: Clinical Measurements: Goal: Ability to maintain clinical measurements within normal limits will  improve Outcome: Progressing Goal: Will remain free from infection Outcome: Progressing Goal: Diagnostic test results will improve Outcome: Progressing Goal: Respiratory complications will improve Outcome: Progressing Goal: Cardiovascular complication will be avoided Outcome: Progressing   Problem: Activity: Goal: Risk for activity intolerance will decrease Outcome: Progressing   Problem: Nutrition: Goal: Adequate nutrition will be maintained Outcome: Progressing   Problem: Coping: Goal: Level of anxiety will decrease Outcome: Progressing   Problem: Elimination: Goal: Will not experience complications related to bowel motility Outcome: Progressing Goal: Will not experience complications related to urinary retention Outcome: Progressing   Problem: Pain Managment: Goal: General experience of comfort will improve Outcome: Progressing   Problem: Safety: Goal: Ability to remain free from injury will improve Outcome: Progressing   Problem: Skin Integrity: Goal: Risk for impaired skin integrity will decrease Outcome: Progressing   Problem: Education: Goal: Knowledge of disease or condition will improve Outcome: Progressing Goal: Knowledge of the prescribed therapeutic regimen will improve Outcome: Progressing   Problem: Fluid Volume: Goal: Peripheral tissue perfusion will improve Outcome: Progressing   Problem: Clinical Measurements: Goal: Complications related to disease process, condition or treatment will be avoided or minimized Outcome: Progressing   Problem: Education: Goal: Knowledge of the prescribed therapeutic regimen will improve Outcome: Progressing Goal: Understanding of sexual limitations or changes related to disease process or condition will improve Outcome: Progressing Goal: Individualized Educational Video(s) Outcome: Progressing   Problem: Self-Concept: Goal: Communication of feelings regarding changes in body function or appearance will  improve Outcome: Progressing   Problem: Skin Integrity: Goal: Demonstration of wound healing without infection will improve Outcome: Progressing   Problem: Education: Goal: Knowledge of condition will improve Outcome: Progressing Goal: Individualized Educational Video(s) Outcome: Progressing Goal: Individualized Newborn Educational Video(s) Outcome: Progressing   Problem: Activity: Goal: Will verbalize the importance of balancing activity with adequate rest periods Outcome: Progressing Goal: Ability to tolerate increased activity will improve Outcome: Progressing   Problem: Coping: Goal: Ability to identify and utilize available resources and services will improve   Outcome: Progressing   Problem: Life Cycle: Goal: Chance of risk for complications during the postpartum period will decrease Outcome: Progressing   Problem: Role Relationship: Goal: Ability to demonstrate positive interaction with newborn will improve Outcome: Progressing

## 2022-06-08 NOTE — Lactation Note (Signed)
This note was copied from a baby's chart. Lactation Consultation Note  Patient Name: Danielle Padilla [id number] Date: 06/08/2022 Reason for consult: Follow-up assessment Age:26 hours  P1, 37w.  < 6 lbs.  Baby mainly formula feeding but mother states she will pump once home. Recommend q 3 hours and provided mother with milk storage guidelines.   Feed on demand with cues.  Goal 8-12+ times per day after first 24 hrs.   Reviewed engorgement care and monitoring voids/stools. Continue to offer breast and allow baby to nuzzle.   Maternal Data Does the patient have breastfeeding experience prior to this delivery?: No  Feeding Mother's Current Feeding Choice: Breast Milk and Formula Nipple Type: Extra Slow Flow  Lactation Tools Discussed/Used  DEBP  Interventions Interventions: Breast feeding basics reviewed;Education;DEBP  Discharge Discharge Education: Engorgement and breast care;Warning signs for feeding baby Pump: Personal  Consult Status Consult Status: Complete Date: 06/08/22    [name] [facility] 06/08/2022, 11:27 AM

## 2022-06-11 ENCOUNTER — Inpatient Hospital Stay (HOSPITAL_COMMUNITY): Payer: Managed Care, Other (non HMO)

## 2022-06-14 ENCOUNTER — Telehealth (HOSPITAL_COMMUNITY): Payer: Self-pay

## 2022-06-14 NOTE — Telephone Encounter (Signed)
Patient did not answer phone call. Voicemail left for patient.   Sharyn Lull [facility] 06/14/22,1430

## 2022-08-11 IMAGING — DX DG ABDOMEN 2V
3 series · 3 of 3 positions shown · non-contrast
Comparison: July 14, 2012

CLINICAL DATA: Right flank pain

EXAM:
X-RAY ABDOMEN 2 VIEWS

[abdomen erect]
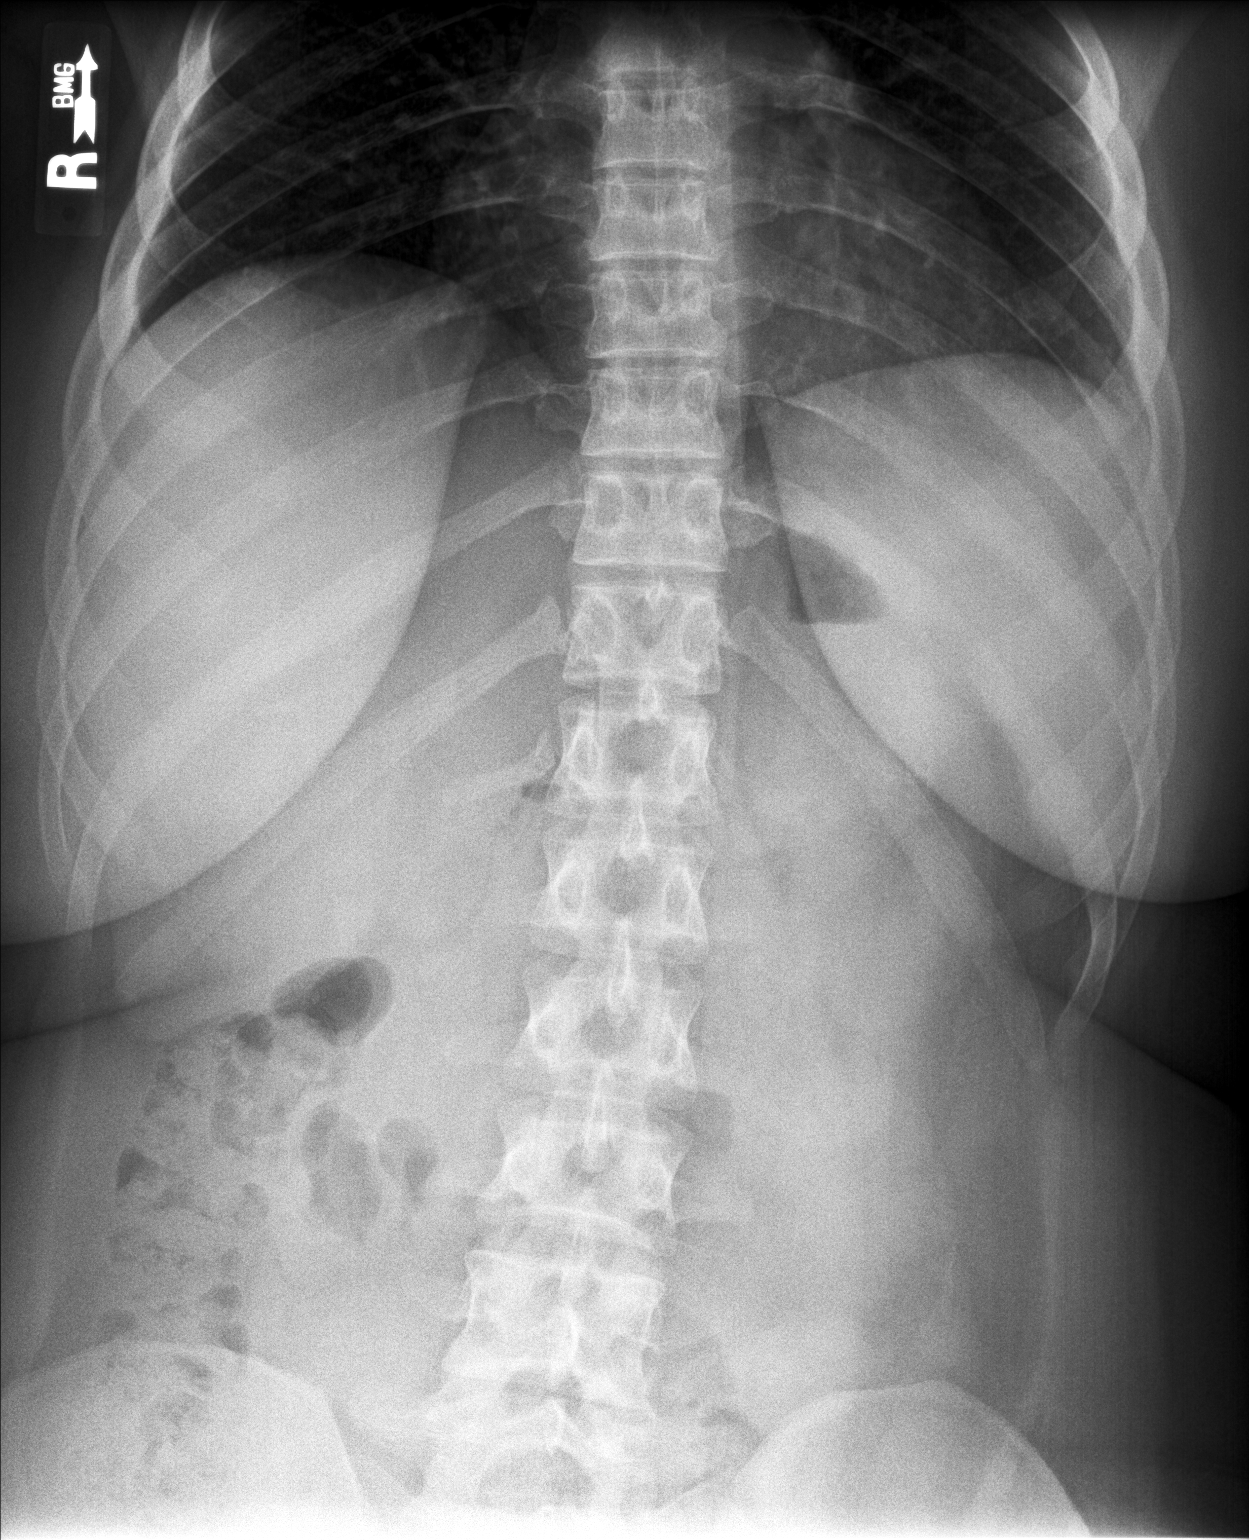

[abdomen supine (1 of 2)]
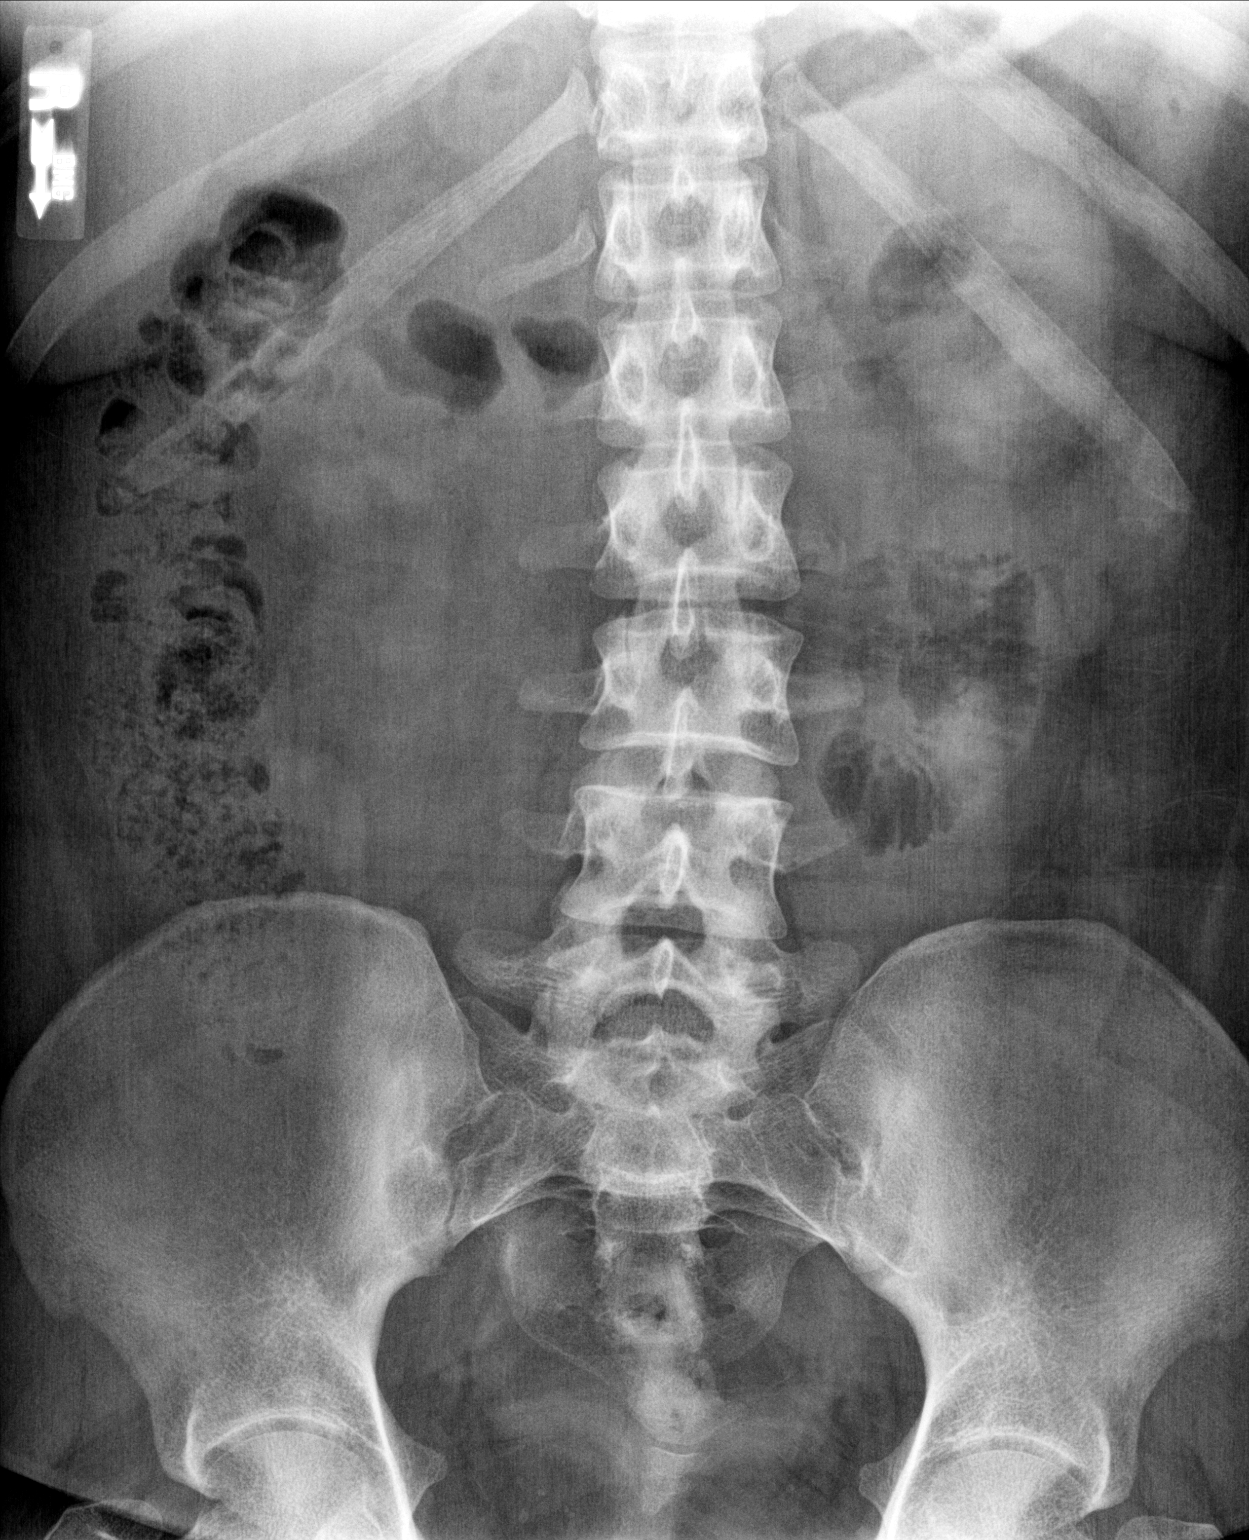

[abdomen supine (2 of 2)]
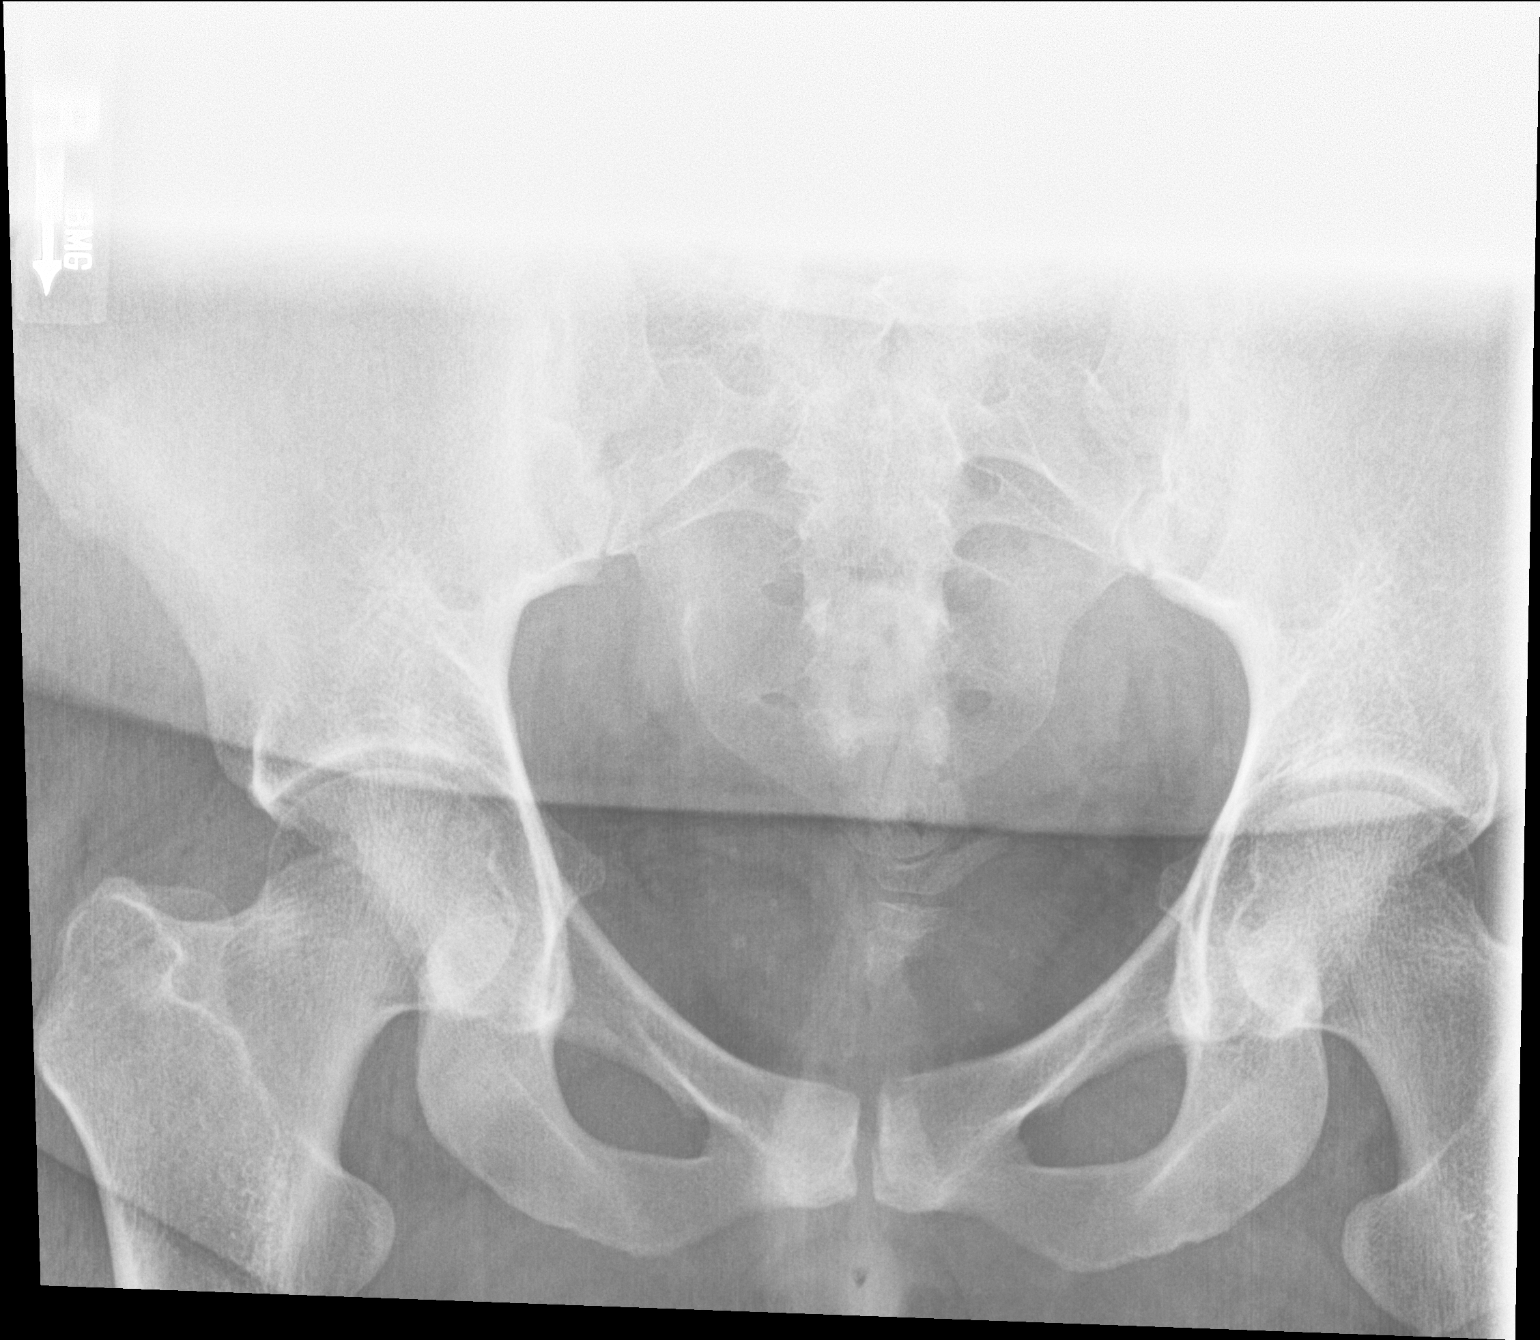

[3 of 3 positions shown; findings below may reference images not displayed]

FINDINGS: Supine and upright images were obtained. There is moderate stool
throughout the colon. There is no bowel dilatation or air-fluid
level to suggest bowel obstruction. No evident free air. Probable
small phleboliths noted in the pelvis, not appreciably changed from
prior study. Lung bases clear.
IMPRESSION: Moderate stool in colon. No bowel obstruction or free air. Lung
bases clear.

## 2022-12-08 DIAGNOSIS — Z3A34 34 weeks gestation of pregnancy: Secondary | ICD-10-CM

## 2023-02-18 NOTE — Progress Notes (Signed)
Patient went to [facility] on 01/22/23 and confirmed pregnancy via positive UPT. LMP 11/02/22 with EDD on 08/09/23 based on reported LMP.   Maureen Ralphs RN on 02/18/23 at 1620

## 2023-03-03 ENCOUNTER — Telehealth: Payer: Medicaid Other

## 2023-03-03 DIAGNOSIS — J45909 Unspecified asthma, uncomplicated: Secondary | ICD-10-CM | POA: Insufficient documentation

## 2023-03-03 DIAGNOSIS — O0992 Supervision of high risk pregnancy, unspecified, second trimester: Secondary | ICD-10-CM

## 2023-03-03 DIAGNOSIS — O99210 Obesity complicating pregnancy, unspecified trimester: Secondary | ICD-10-CM | POA: Insufficient documentation

## 2023-03-03 DIAGNOSIS — E669 Obesity, unspecified: Secondary | ICD-10-CM | POA: Insufficient documentation

## 2023-03-03 DIAGNOSIS — O139 Gestational [pregnancy-induced] hypertension without significant proteinuria, unspecified trimester: Secondary | ICD-10-CM | POA: Insufficient documentation

## 2023-03-03 DIAGNOSIS — Z3A15 15 weeks gestation of pregnancy: Secondary | ICD-10-CM

## 2023-03-03 DIAGNOSIS — O0990 Supervision of high risk pregnancy, unspecified, unspecified trimester: Secondary | ICD-10-CM | POA: Insufficient documentation

## 2023-03-03 DIAGNOSIS — L910 Hypertrophic scar: Secondary | ICD-10-CM

## 2023-03-03 HISTORY — DX: Hypertrophic scar: L91.0

## 2023-03-03 HISTORY — DX: Gestational (pregnancy-induced) hypertension without significant proteinuria, unspecified trimester: O13.9

## 2023-03-03 MED ORDER — BLOOD PRESSURE KIT DEVI: 1.0000 | 0 refills | Status: AC | PRN

## 2023-03-03 NOTE — Progress Notes (Signed)
New OB Intake  I connected with Danielle Padilla  on 03/03/23 at  9:15 AM EDT by MyChart Video Visit and verified that I am speaking with the correct person using two identifiers. Nurse is located at [facility] and pt is located at home.  I discussed the limitations, risks, security and privacy concerns of performing an evaluation and management service by telephone and the availability of in person appointments. I also discussed with the patient that there may be a patient responsible charge related to this service. The patient expressed understanding and agreed to proceed.  I explained I am completing New OB Intake today. We discussed EDD of 08/20/2023, by Last Menstrual Period. Pt is G2P1001. I reviewed her allergies, medications and Medical/Surgical/OB history.    Patient Active Problem List   Diagnosis Date Noted   Obesity 03/03/2023   Asthma 03/03/2023   Supervision of high risk pregnancy, antepartum 03/03/2023   Polycystic ovary syndrome 07/19/2018    Concerns addressed today  Delivery Plans Plans to deliver at [facility]. Discussed the nature of our practice with multiple providers including residents and students. Due to the size of the practice, the delivering provider may not be the same as those providing prenatal care.   Patient is interested in water birth. Offered upcoming OB visit with CNM to discuss further.  MyChart/Babyscripts MyChart access verified. I explained pt will have some visits in office and some virtually. Babyscripts instructions given and order placed. Patient verifies receipt of registration text/e-mail. Account successfully created and app downloaded.  Blood Pressure Cuff/Weight Scale Blood pressure cuff ordered for patient to pick-up from [employer]. Explained after first prenatal appt pt will check weekly and document in Babyscripts.  Anatomy US Pt unsure of LMP. Dating ultrasound scheduled. Please schedule anatomy ultrasound at new ob  visit.   Is patient a CenteringPregnancy candidate?  Declined Declined due to Declined to say   Is patient a Mom+Baby Combined Care candidate?  Not a candidate   If accepted, confirm patient does not intend to move from the area for at least 12 months, then notify Mom+Baby staff  Interested in [place]? If yes, send referral and doula dot phrase.   Is patient a candidate for Babyscripts Optimization? No - Hx GHTN  First visit review I reviewed new OB appt with patient. Explained pt will be seen by Dr. Crissie Reese at first visit. Discussed Avelina Laine genetic screening with patient.  Panorama and Horizon.. Routine prenatal labs is needed at new ob visit.   Last Pap No results found for: "DIAGPAP"  Lowry Bowl, CMA 03/03/2023  9:30 AM

## 2023-03-03 NOTE — Patient Instructions (Signed)
Safe Medications in Pregnancy   Acne:  Benzoyl Peroxide  Salicylic Acid   Backache/Headache:  Tylenol: 2 regular strength every 4 hours OR               2 Extra strength every 6 hours   Colds/Coughs/Allergies:  Benadryl (alcohol free) 25 mg every 6 hours as needed  Breath right strips  Claritin  Cepacol throat lozenges  Chloraseptic throat spray  Cold-Eeze- up to three times per day  Cough drops, alcohol free  Flonase (by prescription only)  Guaifenesin  Mucinex  Robitussin DM (plain only, alcohol free)  Saline nasal spray/drops  Sudafed (pseudoephedrine) & Actifed * use only after [redacted] weeks gestation and if you do not have high blood pressure  Tylenol  Vicks Vaporub  Zinc lozenges  Zyrtec   Constipation:  Colace  Ducolax suppositories  Fleet enema  Glycerin suppositories  Metamucil  Milk of magnesia  Miralax  Senokot  Smooth move tea   Diarrhea:  Kaopectate  Imodium A-D   *NO pepto Bismol   Hemorrhoids:  Anusol  Anusol HC  Preparation H  Tucks   Indigestion:  Tums  Maalox  Mylanta  Zantac  Pepcid   Insomnia:  Benadryl (alcohol free) 25mg  every 6 hours as needed  Tylenol PM  Unisom, no Gelcaps   Leg Cramps:  Tums  MagGel   Nausea/Vomiting:  Bonine  Dramamine  Emetrol  Ginger extract  Sea bands  Meclizine  Nausea medication to take during pregnancy:  Unisom (doxylamine succinate 25 mg tablets) Take one tablet daily at bedtime. If symptoms are not adequately controlled, the dose can be increased to a maximum recommended dose of two tablets daily (1/2 tablet in the morning, 1/2 tablet mid-afternoon and one at bedtime).  Vitamin B6 100mg  tablets. Take one tablet twice a day (up to 200 mg per day).   Skin Rashes:  Aveeno products  Benadryl cream or 25mg  every 6 hours as needed  Calamine Lotion  1% cortisone cream   Yeast infection:  Gyne-lotrimin 7  Monistat 7    **If taking multiple medications, please check labels to avoid  duplicating the same active ingredients  **take medication as directed on the label  ** Do not exceed 4000 mg of tylenol in 24 hours  **Do not take medications that contain aspirin or ibuprofen            Considering Waterbirth? Guide for patients at [facility]) Why consider waterbirth? Gentle birth for babies  Less pain medicine used in labor  May allow for passive descent/less pushing  May reduce perineal tears  More mobility and instinctive maternal position changes  Increased maternal relaxation   Is waterbirth safe? What are the risks of infection, drowning or other complications? Infection:  Very low risk (3.7 % for tub vs 4.8% for bed)  7 in 8000 waterbirths with documented infection  Poorly cleaned equipment most common cause  Slightly lower group B strep transmission rate  Drowning  Maternal:  Very low risk  Related to seizures or fainting  Newborn:  Very low risk. No evidence of increased risk of respiratory problems in multiple large studies  Physiological protection from breathing under water  Avoid underwater birth if there are any fetal complications  Once baby's head is out of the water, keep it out.  Birth complication  Some reports of cord trauma, but risk decreased by bringing baby to surface gradually  No evidence of increased risk of shoulder dystocia. Mothers  can usually change positions faster in water than in a bed, possibly aiding the maneuvers to free the shoulder.   There are 2 things you MUST do to have a waterbirth with [facility]: Attend a waterbirth class at [facility]   3rd Wednesday of every month from 7-9 pm (virtual during COVID) [employer] at [email or web address] or [email or web address] or by calling [phone] Bring [place] the certificate from the class to your prenatal appointment or send via MyChart Meet with a midwife at 36 weeks* to see if you can still plan a waterbirth  and to sign the consent.   *We also recommend that you schedule as many of your prenatal visits with a midwife as possible.    Helpful information: You may want to bring a bathing suit top to the hospital to wear during labor but this is optional.  All other supplies are provided by the hospital. Please arrive at the hospital with signs of active labor, and do not wait at home until late in labor. It takes 45 min- 1 hour for fetal monitoring, and check in to your room to take place, plus transport and filling of the waterbirth tub.    Things that would prevent you from having a waterbirth: Premature, <37wks  Previous cesarean birth  Presence of thick meconium-stained fluid  Multiple gestation (Twins, triplets, etc.)  Uncontrolled diabetes or gestational diabetes requiring medication  Hypertension diagnosed in pregnancy or preexisting hypertension (gestational hypertension, preeclampsia, or chronic hypertension) Fetal growth restriction (your baby measures less than 10th percentile on ultrasound) Heavy vaginal bleeding  Non-reassuring fetal heart rate  Active infection (MRSA, etc.). Group B Strep is NOT a contraindication for waterbirth.  If your labor has to be induced and induction method requires continuous monitoring of the baby's heart rate  Other risks/issues identified by your obstetrical provider   Please remember that birth is unpredictable. Under certain unforeseeable circumstances your provider may advise against giving birth in the tub. These decisions will be made on a case-by-case basis and with the safety of you and your baby as our highest priority.

## 2023-03-04 ENCOUNTER — Ambulatory Visit (INDEPENDENT_AMBULATORY_CARE_PROVIDER_SITE_OTHER): Payer: Medicaid Other

## 2023-03-04 ENCOUNTER — Other Ambulatory Visit: Payer: Self-pay

## 2023-03-04 DIAGNOSIS — Z3A15 15 weeks gestation of pregnancy: Secondary | ICD-10-CM

## 2023-03-04 DIAGNOSIS — O0990 Supervision of high risk pregnancy, unspecified, unspecified trimester: Secondary | ICD-10-CM

## 2023-03-04 DIAGNOSIS — O0992 Supervision of high risk pregnancy, unspecified, second trimester: Secondary | ICD-10-CM

## 2023-03-10 ENCOUNTER — Other Ambulatory Visit (HOSPITAL_COMMUNITY)
Admission: RE | Admit: 2023-03-10 | Discharge: 2023-03-10 | Disposition: A | Discharge status: Home / Self Care | Payer: Medicaid Other | Source: Ambulatory Visit | Attending: Family Medicine | Admitting: Family Medicine

## 2023-03-10 ENCOUNTER — Encounter: Payer: Self-pay | Admitting: Family Medicine

## 2023-03-10 ENCOUNTER — Ambulatory Visit (INDEPENDENT_AMBULATORY_CARE_PROVIDER_SITE_OTHER): Payer: Medicaid Other | Admitting: Family Medicine

## 2023-03-10 ENCOUNTER — Other Ambulatory Visit: Payer: Self-pay

## 2023-03-10 VITALS — BP 107/73 | HR 82 | Wt 258.0 lb

## 2023-03-10 DIAGNOSIS — Z8759 Personal history of other complications of pregnancy, childbirth and the puerperium: Secondary | ICD-10-CM | POA: Diagnosis not present

## 2023-03-10 DIAGNOSIS — O0990 Supervision of high risk pregnancy, unspecified, unspecified trimester: Secondary | ICD-10-CM

## 2023-03-10 DIAGNOSIS — Z1332 Encounter for screening for maternal depression: Secondary | ICD-10-CM

## 2023-03-10 DIAGNOSIS — O0991 Supervision of high risk pregnancy, unspecified, first trimester: Secondary | ICD-10-CM | POA: Diagnosis not present

## 2023-03-10 DIAGNOSIS — Z98891 History of uterine scar from previous surgery: Secondary | ICD-10-CM

## 2023-03-10 DIAGNOSIS — Z3A12 12 weeks gestation of pregnancy: Secondary | ICD-10-CM

## 2023-03-10 MED ORDER — ASPIRIN 81 MG PO TBEC: 81.0000 mg | DELAYED_RELEASE_TABLET | Freq: Every day | ORAL | 12 refills | Status: DC

## 2023-03-10 NOTE — Patient Instructions (Signed)

## 2023-03-10 NOTE — Progress Notes (Signed)
Subjective:   Zamariyah Gillis [place] is a 27 y.o. G2P1001 at [redacted]w[redacted]d by 11 wk [place] being seen today for her first obstetrical visit.  Her obstetrical history is significant for  gestational hypertension . Patient does intend to breast feed. Pregnancy history fully reviewed.  Patient reports no complaints.  HISTORY: OB History  Gravida Para Term Preterm AB Living  2 1 1  0 0 1  SAB IAB Ectopic Multiple Live Births  0 0 0 0 1    # Outcome Date GA Lbr Len/2nd Weight Sex Type Anes PTL Lv  2 Current           1 Term 06/06/22 [redacted]w[redacted]d  5 lb 15 oz (2.693 kg) F CS-LTranv Spinal  LIV     Name: TAHJA, EIDEM Marixa     Apgar1: 8  Apgar5: 9     Last pap smear: No results found for: "DIAGPAP", "HPV", "HPVHIGH" *ROI signed for [facility]*  Past Medical History:  Diagnosis Date   Asthma    Keloid 03/03/2023   PCOS (polycystic ovarian syndrome)    Pregnancy induced hypertension    Pregnancy-induced hypertension 03/03/2023   Past Surgical History:  Procedure Laterality Date   CESAREAN SECTION N/A 06/06/2022   Procedure: CESAREAN SECTION;  Surgeon: Candice Camp, MD;  Location: [facility] LD ORS;  Service: Obstetrics;  Laterality: N/A;   EYE SURGERY     Family History  Problem Relation Age of Onset   Diabetes Mother    Asthma Father    Hypertension Father    Asthma Brother    Cancer Maternal Uncle    Cancer Maternal Grandmother    Diabetes Paternal Grandmother    Social History   Tobacco Use   Smoking status: Never   Smokeless tobacco: Never  Vaping Use   Vaping status: Never Used  Substance Use Topics   Alcohol use: No   Drug use: No   No Known Allergies Current Outpatient Medications on File Prior to Visit  Medication Sig Dispense Refill   Blood Pressure Monitoring (BLOOD PRESSURE KIT) DEVI 1 each by Does not apply route as needed. 1 each 0   Prenatal Vit-Fe Fumarate-FA (PRENATAL MULTIVITAMIN) TABS tablet Take 1 tablet by mouth daily at 12 noon.      No current facility-administered medications on file prior to visit.     Exam   Vitals:   03/10/23 1004  BP: 107/73  Pulse: 82  Weight: 258 lb (117 kg)   Fetal Heart Rate (bpm): 156  System: General: well-developed, well-nourished female in no acute distress   Skin: normal coloration and turgor, no rashes   Neurologic: oriented, normal, negative, normal mood   Extremities: normal strength, tone, and muscle mass, ROM of all joints is normal   HEENT PERRLA, extraocular movement intact and sclera clear, anicteric   Neck supple and no masses   Respiratory:  no respiratory distress      Assessment:   Pregnancy: G2P1001 Patient Active Problem List   Diagnosis Date Noted   History of gestational hypertension 03/10/2023   History of cesarean delivery 03/10/2023   Obesity 03/03/2023   Asthma 03/03/2023   Supervision of high risk pregnancy, antepartum 03/03/2023   Polycystic ovary syndrome 07/19/2018     Plan:  1. Supervision of high risk pregnancy, antepartum BP and FHR normal Initial labs drawn. Continue prenatal vitamins. Genetic Screening discussed, NIPS: ordered. Ultrasound discussed; fetal anatomic survey: ordered. Problem list reviewed and updated. The nature of [facility] with multiple MDs and other Advanced Practice Providers was explained to patient; also emphasized that residents, students are part of our team.  2. History of gestational hypertension Start ASA 81 mg Baseline labs today  3. History of cesarean delivery pLTCS for "failed induction" after 12 hours Leaning towards TOLAC, encouraged her to consider this option, discuss more at future visits   Routine obstetric precautions reviewed. Return in 4 weeks (on 04/07/2023) for [facility], ob visit.    Venora Maples, MD/MPH Attending Family Medicine Physician, [facility] for [facility]

## 2023-03-11 ENCOUNTER — Encounter: Payer: Self-pay | Admitting: Family Medicine

## 2023-03-11 ENCOUNTER — Encounter: Payer: Self-pay | Admitting: *Deleted

## 2023-03-11 DIAGNOSIS — R7401 Elevation of levels of liver transaminase levels: Secondary | ICD-10-CM | POA: Insufficient documentation

## 2023-03-11 LAB — PROTEIN / CREATININE RATIO, URINE
Creatinine, Urine: 322.9 mg/dL
Protein, Ur: 17.1 mg/dL
Protein/Creat Ratio: 53 mg/g{creat} (ref 0–200)

## 2023-03-11 LAB — COMPREHENSIVE METABOLIC PANEL
ALT: 79 [IU]/L — ABNORMAL HIGH (ref 0–32)
AST: 35 [IU]/L (ref 0–40)
Albumin: 4.5 g/dL (ref 4.0–5.0)
Alkaline Phosphatase: 77 [IU]/L (ref 44–121)
BUN/Creatinine Ratio: 11 (ref 9–23)
BUN: 7 mg/dL (ref 6–20)
Bilirubin Total: 0.6 mg/dL (ref 0.0–1.2)
CO2: 22 mmol/L (ref 20–29)
Calcium: 10.2 mg/dL (ref 8.7–10.2)
Chloride: 102 mmol/L (ref 96–106)
Creatinine, Ser: 0.62 mg/dL (ref 0.57–1.00)
Globulin, Total: 2.5 g/dL (ref 1.5–4.5)
Glucose: 91 mg/dL (ref 70–99)
Potassium: 4.4 mmol/L (ref 3.5–5.2)
Sodium: 142 mmol/L (ref 134–144)
Total Protein: 7 g/dL (ref 6.0–8.5)
eGFR: 126 mL/min/{1.73_m2} (ref >59)

## 2023-03-11 LAB — CBC/D/PLT+RPR+RH+ABO+RUBIGG...
Antibody Screen: NEGATIVE
Basophils Absolute: 0 10*3/uL (ref 0.0–0.2)
Basos: 1 %
EOS (ABSOLUTE): 0 10*3/uL (ref 0.0–0.4)
Eos: 1 %
HCV Ab: NONREACTIVE
HIV Screen 4th Generation wRfx: NONREACTIVE
Hematocrit: 35.7 % (ref 34.0–46.6)
Hemoglobin: 12.3 g/dL (ref 11.1–15.9)
Hepatitis B Surface Ag: NEGATIVE
Immature Grans (Abs): 0 10*3/uL (ref 0.0–0.1)
Immature Granulocytes: 0 %
Lymphocytes Absolute: 2.1 10*3/uL (ref 0.7–3.1)
Lymphs: 34 %
MCH: 30.4 pg (ref 26.6–33.0)
MCHC: 34.5 g/dL (ref 31.5–35.7)
MCV: 88 fL (ref 79–97)
Monocytes Absolute: 0.4 10*3/uL (ref 0.1–0.9)
Monocytes: 6 %
Neutrophils Absolute: 3.7 10*3/uL (ref 1.4–7.0)
Neutrophils: 58 %
Platelets: 295 10*3/uL (ref 150–450)
RBC: 4.05 x10E6/uL (ref 3.77–5.28)
RDW: 12.5 % (ref 11.7–15.4)
RPR Ser Ql: NONREACTIVE
Rh Factor: POSITIVE
Rubella Antibodies, IGG: 3.72 {index} (ref >0.99)
WBC: 6.2 10*3/uL (ref 3.4–10.8)

## 2023-03-11 LAB — CERVICOVAGINAL ANCILLARY ONLY
Chlamydia: NEGATIVE
Comment: NEGATIVE
Comment: NORMAL
Neisseria Gonorrhea: NEGATIVE

## 2023-03-11 LAB — HEMOGLOBIN A1C
Est. average glucose Bld gHb Est-mCnc: 105 mg/dL
Hgb A1c MFr Bld: 5.3 % (ref 4.8–5.6)

## 2023-03-11 LAB — HCV INTERPRETATION

## 2023-03-13 LAB — URINE CULTURE, OB REFLEX

## 2023-03-13 LAB — CULTURE, OB URINE

## 2023-03-16 LAB — PANORAMA PRENATAL TEST FULL PANEL:PANORAMA TEST PLUS 5 ADDITIONAL MICRODELETIONS: FETAL FRACTION: 4.3

## 2023-03-19 LAB — HORIZON CUSTOM: REPORT SUMMARY: NEGATIVE

## 2023-04-07 ENCOUNTER — Other Ambulatory Visit: Payer: Self-pay

## 2023-04-07 ENCOUNTER — Ambulatory Visit (INDEPENDENT_AMBULATORY_CARE_PROVIDER_SITE_OTHER): Payer: Medicaid Other | Admitting: Family Medicine

## 2023-04-07 ENCOUNTER — Encounter: Payer: Self-pay | Admitting: Family Medicine

## 2023-04-07 VITALS — BP 110/86 | HR 82 | Wt 257.0 lb

## 2023-04-07 DIAGNOSIS — O0990 Supervision of high risk pregnancy, unspecified, unspecified trimester: Secondary | ICD-10-CM

## 2023-04-07 DIAGNOSIS — R7401 Elevation of levels of liver transaminase levels: Secondary | ICD-10-CM

## 2023-04-07 DIAGNOSIS — O0992 Supervision of high risk pregnancy, unspecified, second trimester: Secondary | ICD-10-CM

## 2023-04-07 DIAGNOSIS — Z98891 History of uterine scar from previous surgery: Secondary | ICD-10-CM

## 2023-04-07 DIAGNOSIS — Z8759 Personal history of other complications of pregnancy, childbirth and the puerperium: Secondary | ICD-10-CM

## 2023-04-07 DIAGNOSIS — Z3A16 16 weeks gestation of pregnancy: Secondary | ICD-10-CM

## 2023-04-07 NOTE — Patient Instructions (Signed)

## 2023-04-07 NOTE — Progress Notes (Signed)
   Subjective:  Danielle Padilla is a 27 y.o. G2P1001 at [redacted]w[redacted]d being seen today for ongoing prenatal care.  She is currently monitored for the following issues for this high-risk pregnancy and has Polycystic ovary syndrome; Obesity; Asthma; Supervision of high risk pregnancy, antepartum; History of gestational hypertension; History of cesarean delivery; and Elevated ALT measurement on their problem list.  Patient reports no complaints.  Contractions: Not present. Vag. Bleeding: None.  Movement: Absent. Denies leaking of fluid.   The following portions of the patient's history were reviewed and updated as appropriate: allergies, current medications, past family history, past medical history, past social history, past surgical history and problem list. Problem list updated.  Objective:   Vitals:   04/07/23 1033  BP: 110/86  Pulse: 82  Weight: 257 lb (116.6 kg)    Fetal Status: Fetal Heart Rate (bpm): 144   Movement: Absent     General:  Alert, oriented and cooperative. Patient is in no acute distress.  Skin: Skin is warm and dry. No rash noted.   Cardiovascular: Normal heart rate noted  Respiratory: Normal respiratory effort, no problems with respiration noted  Abdomen: Soft, gravid, appropriate for gestational age. Pain/Pressure: Absent     Pelvic: Vag. Bleeding: None     Cervical exam deferred        Extremities: Normal range of motion.  Edema: None  Mental Status: Normal mood and affect. Normal behavior. Normal judgment and thought content.   Urinalysis:      Assessment and Plan:  Pregnancy: G2P1001 at [redacted]w[redacted]d  1. Supervision of high risk pregnancy, antepartum BP and FHR normal AFP today  2. History of gestational hypertension On ASA, normomtensive  3. History of cesarean delivery Discuss TOLAC at 28 wk visit  4. Elevated ALT measurement 79 on new OB baseline labs, repeat today  Preterm labor symptoms and general obstetric precautions including but not  limited to vaginal bleeding, contractions, leaking of fluid and fetal movement were reviewed in detail with the patient. Please refer to After Visit Summary for other counseling recommendations.  Return in about 4 weeks (around 05/05/2023) for ob visit, [facility].   Venora Maples, MD

## 2023-04-09 ENCOUNTER — Encounter: Payer: Self-pay | Admitting: Family Medicine

## 2023-04-09 LAB — COMPREHENSIVE METABOLIC PANEL
ALT: 27 [IU]/L (ref 0–32)
AST: 12 [IU]/L (ref 0–40)
Albumin: 4.1 g/dL (ref 4.0–5.0)
Alkaline Phosphatase: 63 [IU]/L (ref 44–121)
BUN/Creatinine Ratio: 9 (ref 9–23)
BUN: 6 mg/dL (ref 6–20)
Bilirubin Total: 0.3 mg/dL (ref 0.0–1.2)
CO2: 20 mmol/L (ref 20–29)
Calcium: 9.5 mg/dL (ref 8.7–10.2)
Chloride: 103 mmol/L (ref 96–106)
Creatinine, Ser: 0.65 mg/dL (ref 0.57–1.00)
Globulin, Total: 2.6 g/dL (ref 1.5–4.5)
Glucose: 91 mg/dL (ref 70–99)
Potassium: 4.1 mmol/L (ref 3.5–5.2)
Sodium: 136 mmol/L (ref 134–144)
Total Protein: 6.7 g/dL (ref 6.0–8.5)
eGFR: 124 mL/min/{1.73_m2} (ref >59)

## 2023-04-09 LAB — AFP, SERUM, OPEN SPINA BIFIDA
AFP MoM: 1.13
AFP Value: 27.3 ng/mL
Gest. Age on Collection Date: 16 wk
Maternal Age At EDD: 26.7 a
OSBR Risk 1 IN: 8106
Test Results:: NEGATIVE
Weight: 257 [lb_av]

## 2023-04-28 ENCOUNTER — Other Ambulatory Visit: Payer: Self-pay

## 2023-04-28 ENCOUNTER — Encounter: Payer: Self-pay | Admitting: *Deleted

## 2023-04-28 ENCOUNTER — Encounter: Payer: Self-pay | Admitting: Family Medicine

## 2023-04-28 ENCOUNTER — Ambulatory Visit: Payer: Medicaid Other | Admitting: *Deleted

## 2023-04-28 ENCOUNTER — Ambulatory Visit: Payer: Medicaid Other | Attending: Family Medicine

## 2023-04-28 VITALS — BP 112/62 | HR 72

## 2023-04-28 DIAGNOSIS — O34219 Maternal care for unspecified type scar from previous cesarean delivery: Secondary | ICD-10-CM

## 2023-04-28 DIAGNOSIS — E669 Obesity, unspecified: Secondary | ICD-10-CM

## 2023-04-28 DIAGNOSIS — O09292 Supervision of pregnancy with other poor reproductive or obstetric history, second trimester: Secondary | ICD-10-CM

## 2023-04-28 DIAGNOSIS — O0990 Supervision of high risk pregnancy, unspecified, unspecified trimester: Secondary | ICD-10-CM | POA: Insufficient documentation

## 2023-04-28 DIAGNOSIS — O99212 Obesity complicating pregnancy, second trimester: Secondary | ICD-10-CM | POA: Diagnosis not present

## 2023-04-28 DIAGNOSIS — Z3A19 19 weeks gestation of pregnancy: Secondary | ICD-10-CM

## 2023-04-29 ENCOUNTER — Other Ambulatory Visit: Payer: Self-pay | Admitting: *Deleted

## 2023-04-29 DIAGNOSIS — O99212 Obesity complicating pregnancy, second trimester: Secondary | ICD-10-CM

## 2023-05-05 ENCOUNTER — Ambulatory Visit: Payer: Medicaid Other | Admitting: Family Medicine

## 2023-05-05 ENCOUNTER — Encounter: Payer: Self-pay | Admitting: Family Medicine

## 2023-05-05 ENCOUNTER — Other Ambulatory Visit: Payer: Self-pay

## 2023-05-05 VITALS — BP 120/85 | HR 89 | Wt 259.5 lb

## 2023-05-05 DIAGNOSIS — O34219 Maternal care for unspecified type scar from previous cesarean delivery: Secondary | ICD-10-CM

## 2023-05-05 DIAGNOSIS — Z8759 Personal history of other complications of pregnancy, childbirth and the puerperium: Secondary | ICD-10-CM

## 2023-05-05 DIAGNOSIS — O99210 Obesity complicating pregnancy, unspecified trimester: Secondary | ICD-10-CM

## 2023-05-05 DIAGNOSIS — O0992 Supervision of high risk pregnancy, unspecified, second trimester: Secondary | ICD-10-CM

## 2023-05-05 DIAGNOSIS — O0990 Supervision of high risk pregnancy, unspecified, unspecified trimester: Secondary | ICD-10-CM

## 2023-05-05 DIAGNOSIS — R7401 Elevation of levels of liver transaminase levels: Secondary | ICD-10-CM

## 2023-05-05 DIAGNOSIS — O09292 Supervision of pregnancy with other poor reproductive or obstetric history, second trimester: Secondary | ICD-10-CM

## 2023-05-05 DIAGNOSIS — Z3A20 20 weeks gestation of pregnancy: Secondary | ICD-10-CM

## 2023-05-05 DIAGNOSIS — O99212 Obesity complicating pregnancy, second trimester: Secondary | ICD-10-CM

## 2023-05-05 DIAGNOSIS — Z98891 History of uterine scar from previous surgery: Secondary | ICD-10-CM

## 2023-05-05 NOTE — Patient Instructions (Signed)

## 2023-05-05 NOTE — Progress Notes (Signed)
   Subjective:  Danielle Padilla is a 27 y.o. G2P1001 at [redacted]w[redacted]d being seen today for ongoing prenatal care.  She is currently monitored for the following issues for this low-risk pregnancy and has Polycystic ovary syndrome; Obesity in pregnancy; Asthma; Supervision of high risk pregnancy, antepartum; History of gestational hypertension; and History of cesarean delivery on their problem list.  Patient reports no complaints.  Contractions: Not present. Vag. Bleeding: None.  Movement: Present. Denies leaking of fluid.   The following portions of the patient's history were reviewed and updated as appropriate: allergies, current medications, past family history, past medical history, past social history, past surgical history and problem list. Problem list updated.  Objective:   Vitals:   05/05/23 1042  BP: 120/85  Pulse: 89  Weight: 259 lb 8 oz (117.7 kg)    Fetal Status: Fetal Heart Rate (bpm): 145   Movement: Present     General:  Alert, oriented and cooperative. Patient is in no acute distress.  Skin: Skin is warm and dry. No rash noted.   Cardiovascular: Normal heart rate noted  Respiratory: Normal respiratory effort, no problems with respiration noted  Abdomen: Soft, gravid, appropriate for gestational age. Pain/Pressure: Absent     Pelvic: Vag. Bleeding: None     Cervical exam deferred        Extremities: Normal range of motion.  Edema: None  Mental Status: Normal mood and affect. Normal behavior. Normal judgment and thought content.   Urinalysis:      Assessment and Plan:  Pregnancy: G2P1001 at [redacted]w[redacted]d  1. Supervision of high risk pregnancy, antepartum (Primary) BP and FHR normal  2. Obesity in pregnancy F/w MFM, antenatal testing to start at 32 weeks  3. History of gestational hypertension On ASA, normotensive  4. History of cesarean delivery Discuss TOLAC at 28 wk visit  5. Elevated ALT measurement Resolved on recheck, resolved from problem  list  Preterm labor symptoms and general obstetric precautions including but not limited to vaginal bleeding, contractions, leaking of fluid and fetal movement were reviewed in detail with the patient. Please refer to After Visit Summary for other counseling recommendations.  Return in 4 weeks (on 06/02/2023) for [facility], ob visit.   Venora Maples, MD

## 2023-05-26 ENCOUNTER — Ambulatory Visit: Payer: Medicaid Other | Attending: Obstetrics

## 2023-05-26 ENCOUNTER — Other Ambulatory Visit: Payer: Self-pay | Admitting: *Deleted

## 2023-05-26 DIAGNOSIS — O09292 Supervision of pregnancy with other poor reproductive or obstetric history, second trimester: Secondary | ICD-10-CM | POA: Diagnosis not present

## 2023-05-26 DIAGNOSIS — O34219 Maternal care for unspecified type scar from previous cesarean delivery: Secondary | ICD-10-CM

## 2023-05-26 DIAGNOSIS — E669 Obesity, unspecified: Secondary | ICD-10-CM | POA: Diagnosis not present

## 2023-05-26 DIAGNOSIS — O99212 Obesity complicating pregnancy, second trimester: Secondary | ICD-10-CM

## 2023-05-26 DIAGNOSIS — Z3A23 23 weeks gestation of pregnancy: Secondary | ICD-10-CM

## 2023-05-26 DIAGNOSIS — O99213 Obesity complicating pregnancy, third trimester: Secondary | ICD-10-CM

## 2023-06-09 ENCOUNTER — Encounter: Payer: Self-pay | Admitting: Family Medicine

## 2023-06-09 ENCOUNTER — Ambulatory Visit (INDEPENDENT_AMBULATORY_CARE_PROVIDER_SITE_OTHER): Payer: Medicaid Other | Admitting: Family Medicine

## 2023-06-09 ENCOUNTER — Other Ambulatory Visit: Payer: Self-pay

## 2023-06-09 VITALS — BP 116/73 | HR 86 | Wt 259.8 lb

## 2023-06-09 DIAGNOSIS — Z3A25 25 weeks gestation of pregnancy: Secondary | ICD-10-CM

## 2023-06-09 DIAGNOSIS — O99210 Obesity complicating pregnancy, unspecified trimester: Secondary | ICD-10-CM

## 2023-06-09 DIAGNOSIS — O0990 Supervision of high risk pregnancy, unspecified, unspecified trimester: Secondary | ICD-10-CM

## 2023-06-09 DIAGNOSIS — Z8759 Personal history of other complications of pregnancy, childbirth and the puerperium: Secondary | ICD-10-CM

## 2023-06-09 DIAGNOSIS — Z98891 History of uterine scar from previous surgery: Secondary | ICD-10-CM

## 2023-06-09 NOTE — Progress Notes (Signed)
   Subjective:  Anntionette Gillis [name] is a 28 y.o. G2P1001 at [redacted]w[redacted]d being seen today for ongoing prenatal care.  She is currently monitored for the following issues for this high-risk pregnancy and has Polycystic ovary syndrome; Obesity in pregnancy; Asthma; Supervision of high risk pregnancy, antepartum; History of gestational hypertension; and History of cesarean delivery on their problem list.  Patient reports no complaints.  Contractions: Not present. Vag. Bleeding: None.  Movement: Present. Denies leaking of fluid.   The following portions of the patient's history were reviewed and updated as appropriate: allergies, current medications, past family history, past medical history, past social history, past surgical history and problem list. Problem list updated.  Objective:   Vitals:   06/09/23 0920  BP: 116/73  Pulse: 86  Weight: 259 lb 12.8 oz (117.8 kg)    Fetal Status: Fetal Heart Rate (bpm): 140   Movement: Present     General:  Alert, oriented and cooperative. Patient is in no acute distress.  Skin: Skin is warm and dry. No rash noted.   Cardiovascular: Normal heart rate noted  Respiratory: Normal respiratory effort, no problems with respiration noted  Abdomen: Soft, gravid, appropriate for gestational age. Pain/Pressure: Absent     Pelvic: Vag. Bleeding: None     Cervical exam deferred        Extremities: Normal range of motion.  Edema: None  Mental Status: Normal mood and affect. Normal behavior. Normal judgment and thought content.   Urinalysis:      Assessment and Plan:  Pregnancy: G2P1001 at [redacted]w[redacted]d  1. Supervision of high risk pregnancy, antepartum (Primary) BP and FHR normal Discussed fasting labs for next visit  2. Obesity in pregnancy F/w MFM, antenatal testing to start at 34 weeks  3. History of gestational hypertension On ASA, normotensive  4. History of cesarean delivery Discuss mode of delivery at next visit  Preterm labor symptoms and  general obstetric precautions including but not limited to vaginal bleeding, contractions, leaking of fluid and fetal movement were reviewed in detail with the patient. Please refer to After Visit Summary for other counseling recommendations.  Return in 2 weeks (on 06/23/2023) for [facility], ob visit.   Venora Maples, MD

## 2023-06-09 NOTE — Patient Instructions (Signed)

## 2023-06-19 ENCOUNTER — Other Ambulatory Visit: Payer: Self-pay

## 2023-06-19 DIAGNOSIS — O0990 Supervision of high risk pregnancy, unspecified, unspecified trimester: Secondary | ICD-10-CM

## 2023-06-23 ENCOUNTER — Encounter: Payer: Self-pay | Admitting: Family Medicine

## 2023-06-23 ENCOUNTER — Ambulatory Visit: Payer: Medicaid Other | Admitting: Family Medicine

## 2023-06-23 ENCOUNTER — Other Ambulatory Visit: Payer: Self-pay

## 2023-06-23 ENCOUNTER — Other Ambulatory Visit: Payer: Medicaid Other

## 2023-06-23 VITALS — BP 119/79 | HR 89 | Wt 258.0 lb

## 2023-06-23 DIAGNOSIS — O99212 Obesity complicating pregnancy, second trimester: Secondary | ICD-10-CM

## 2023-06-23 DIAGNOSIS — O0990 Supervision of high risk pregnancy, unspecified, unspecified trimester: Secondary | ICD-10-CM

## 2023-06-23 DIAGNOSIS — Z23 Encounter for immunization: Secondary | ICD-10-CM | POA: Diagnosis not present

## 2023-06-23 DIAGNOSIS — O99210 Obesity complicating pregnancy, unspecified trimester: Secondary | ICD-10-CM

## 2023-06-23 DIAGNOSIS — Z8759 Personal history of other complications of pregnancy, childbirth and the puerperium: Secondary | ICD-10-CM

## 2023-06-23 DIAGNOSIS — Z98891 History of uterine scar from previous surgery: Secondary | ICD-10-CM

## 2023-06-23 DIAGNOSIS — O0992 Supervision of high risk pregnancy, unspecified, second trimester: Secondary | ICD-10-CM

## 2023-06-23 DIAGNOSIS — Z3A27 27 weeks gestation of pregnancy: Secondary | ICD-10-CM

## 2023-06-23 NOTE — Progress Notes (Signed)
   Subjective:  Danielle Padilla is a 28 y.o. G2P1001 at [redacted]w[redacted]d being seen today for ongoing prenatal care.  She is currently monitored for the following issues for this high-risk pregnancy and has Polycystic ovary syndrome; Obesity in pregnancy; Asthma; Supervision of high risk pregnancy, antepartum; History of gestational hypertension; and History of cesarean delivery on their problem list.  Patient reports no complaints.  Contractions: Not present. Vag. Bleeding: None.  Movement: Present. Denies leaking of fluid.   The following portions of the patient's history were reviewed and updated as appropriate: allergies, current medications, past family history, past medical history, past social history, past surgical history and problem list. Problem list updated.  Objective:   Vitals:   06/23/23 0857  BP: 119/79  Pulse: 89  Weight: 258 lb (117 kg)    Fetal Status: Fetal Heart Rate (bpm): 135   Movement: Present     General:  Alert, oriented and cooperative. Patient is in no acute distress.  Skin: Skin is warm and dry. No rash noted.   Cardiovascular: Normal heart rate noted  Respiratory: Normal respiratory effort, no problems with respiration noted  Abdomen: Soft, gravid, appropriate for gestational age. Pain/Pressure: Absent     Pelvic: Vag. Bleeding: None     Cervical exam deferred        Extremities: Normal range of motion.  Edema: None  Mental Status: Normal mood and affect. Normal behavior. Normal judgment and thought content.   Urinalysis:      Assessment and Plan:  Pregnancy: G2P1001 at [redacted]w[redacted]d  1. Supervision of high risk pregnancy, antepartum (Primary) BP and FHR normal Third trimester labs today TDAP accepted  2. Obesity in pregnancy F/w MFM, normal growth to date Antenatal testing to start at 34 weeks  3. History of gestational hypertension On ASA, normotensive  4. History of cesarean delivery Discussed RCS vs TOLAC On the whole is nervous but  leaning towards TOLAC, risks and benefits reviewed, consent signed  Preterm labor symptoms and general obstetric precautions including but not limited to vaginal bleeding, contractions, leaking of fluid and fetal movement were reviewed in detail with the patient. Please refer to After Visit Summary for other counseling recommendations.  Return in 2 weeks (on 07/07/2023) for [facility], ob visit.   Lola Donnice HERO, MD

## 2023-06-23 NOTE — Patient Instructions (Signed)

## 2023-06-24 LAB — CBC
Hematocrit: 32.3 % — ABNORMAL LOW (ref 34.0–46.6)
Hemoglobin: 10.9 g/dL — ABNORMAL LOW (ref 11.1–15.9)
MCH: 29.9 pg (ref 26.6–33.0)
MCHC: 33.7 g/dL (ref 31.5–35.7)
MCV: 89 fL (ref 79–97)
Platelets: 261 10*3/uL (ref 150–450)
RBC: 3.64 x10E6/uL — ABNORMAL LOW (ref 3.77–5.28)
RDW: 11.9 % (ref 11.7–15.4)
WBC: 7.4 10*3/uL (ref 3.4–10.8)

## 2023-06-24 LAB — HIV ANTIBODY (ROUTINE TESTING W REFLEX): HIV Screen 4th Generation wRfx: NONREACTIVE

## 2023-06-24 LAB — GLUCOSE TOLERANCE, 2 HOURS W/ 1HR
Glucose, 1 hour: 141 mg/dL (ref 70–179)
Glucose, 2 hour: 135 mg/dL (ref 70–152)
Glucose, Fasting: 87 mg/dL (ref 70–91)

## 2023-06-24 LAB — RPR: RPR Ser Ql: NONREACTIVE

## 2023-06-25 ENCOUNTER — Encounter: Payer: Self-pay | Admitting: Family Medicine

## 2023-06-30 ENCOUNTER — Ambulatory Visit: Payer: Medicaid Other | Attending: Obstetrics

## 2023-06-30 ENCOUNTER — Other Ambulatory Visit: Payer: Self-pay | Admitting: *Deleted

## 2023-06-30 DIAGNOSIS — Z3A28 28 weeks gestation of pregnancy: Secondary | ICD-10-CM

## 2023-06-30 DIAGNOSIS — O99213 Obesity complicating pregnancy, third trimester: Secondary | ICD-10-CM

## 2023-06-30 DIAGNOSIS — Z8759 Personal history of other complications of pregnancy, childbirth and the puerperium: Secondary | ICD-10-CM

## 2023-06-30 DIAGNOSIS — O34219 Maternal care for unspecified type scar from previous cesarean delivery: Secondary | ICD-10-CM

## 2023-06-30 DIAGNOSIS — O09293 Supervision of pregnancy with other poor reproductive or obstetric history, third trimester: Secondary | ICD-10-CM | POA: Diagnosis not present

## 2023-06-30 DIAGNOSIS — E669 Obesity, unspecified: Secondary | ICD-10-CM | POA: Diagnosis not present

## 2023-07-08 ENCOUNTER — Encounter: Payer: Self-pay | Admitting: Obstetrics and Gynecology

## 2023-07-08 ENCOUNTER — Encounter: Payer: Medicaid Other | Admitting: Obstetrics and Gynecology

## 2023-07-08 DIAGNOSIS — Z6841 Body Mass Index (BMI) 40.0 and over, adult: Secondary | ICD-10-CM | POA: Insufficient documentation

## 2023-07-21 ENCOUNTER — Ambulatory Visit: Payer: Medicaid Other | Admitting: Advanced Practice Midwife

## 2023-07-21 ENCOUNTER — Other Ambulatory Visit: Payer: Self-pay

## 2023-07-21 VITALS — BP 113/78 | HR 88 | Wt 261.1 lb

## 2023-07-21 DIAGNOSIS — O0993 Supervision of high risk pregnancy, unspecified, third trimester: Secondary | ICD-10-CM | POA: Diagnosis not present

## 2023-07-21 DIAGNOSIS — Z8759 Personal history of other complications of pregnancy, childbirth and the puerperium: Secondary | ICD-10-CM

## 2023-07-21 DIAGNOSIS — O99210 Obesity complicating pregnancy, unspecified trimester: Secondary | ICD-10-CM

## 2023-07-21 DIAGNOSIS — O0990 Supervision of high risk pregnancy, unspecified, unspecified trimester: Secondary | ICD-10-CM

## 2023-07-21 DIAGNOSIS — Z3A31 31 weeks gestation of pregnancy: Secondary | ICD-10-CM

## 2023-07-21 DIAGNOSIS — Z98891 History of uterine scar from previous surgery: Secondary | ICD-10-CM

## 2023-07-21 DIAGNOSIS — O99213 Obesity complicating pregnancy, third trimester: Secondary | ICD-10-CM

## 2023-07-21 NOTE — Progress Notes (Signed)
   PRENATAL VISIT NOTE  Subjective:  Adeline Gillis [name] is a 28 y.o. G2P1001 at [redacted]w[redacted]d being seen today for ongoing prenatal care.  She is currently monitored for the following issues for this low-risk pregnancy and has Obesity in pregnancy; Asthma; Supervision of high risk pregnancy, antepartum; History of gestational hypertension; History of cesarean delivery; and BMI 40.0-44.9, adult (HCC) on their problem list.  Patient reports no complaints.  Contractions: Not present. Vag. Bleeding: None.  Movement: Present. Denies leaking of fluid.   The following portions of the patient's history were reviewed and updated as appropriate: allergies, current medications, past family history, past medical history, past social history, past surgical history and problem list.   Objective:   Vitals:   07/21/23 1645  BP: 113/78  Pulse: 88  Weight: 261 lb 2 oz (118.4 kg)    Fetal Status: Fetal Heart Rate (bpm): 156 Fundal Height: 34 cm Movement: Present  Presentation: Vertex  General:  Alert, oriented and cooperative. Patient is in no acute distress.  Skin: Skin is warm and dry. No rash noted.   Cardiovascular: Normal heart rate noted  Respiratory: Normal respiratory effort, no problems with respiration noted  Abdomen: Soft, gravid, appropriate for gestational age.  Pain/Pressure: Absent     Pelvic: Cervical exam deferred        Extremities: Normal range of motion.  Edema: None  Mental Status: Normal mood and affect. Normal behavior. Normal judgment and thought content.   Assessment and Plan:  Pregnancy: G2P1001 at [redacted]w[redacted]d 1. Supervision of high risk pregnancy, antepartum (Primary)   2. Obesity in pregnancy - Antenatal testing per MFM  3. History of gestational hypertension - Nml today  4. History of cesarean delivery Plans TOLAC [x]  consent  5. [redacted] weeks gestation of pregnancy   Preterm labor symptoms and general obstetric precautions including but not limited to vaginal  bleeding, contractions, leaking of fluid and fetal movement were reviewed in detail with the patient. Please refer to After Visit Summary for other counseling recommendations.   Return in about 2 weeks (around 08/04/2023) for ROB.  Future Appointments  Date Time Provider Department Center  07/29/2023  2:30 PM [facility] US3 [facility]  08/11/2023  2:30 PM [facility] US1 [facility]  08/18/2023  9:30 AM [facility] US3 [facility]  08/25/2023  9:30 AM [facility] US3 [facility]  09/01/2023  2:30 PM [facility] US1 [facility]    Dorathy Kinsman, CNM

## 2023-07-29 ENCOUNTER — Ambulatory Visit: Payer: Medicaid Other | Attending: Maternal & Fetal Medicine

## 2023-07-29 ENCOUNTER — Other Ambulatory Visit: Payer: Self-pay

## 2023-07-29 DIAGNOSIS — O09293 Supervision of pregnancy with other poor reproductive or obstetric history, third trimester: Secondary | ICD-10-CM

## 2023-07-29 DIAGNOSIS — Z3A32 32 weeks gestation of pregnancy: Secondary | ICD-10-CM

## 2023-07-29 DIAGNOSIS — O99213 Obesity complicating pregnancy, third trimester: Secondary | ICD-10-CM | POA: Diagnosis not present

## 2023-07-29 DIAGNOSIS — Z8759 Personal history of other complications of pregnancy, childbirth and the puerperium: Secondary | ICD-10-CM | POA: Insufficient documentation

## 2023-07-29 DIAGNOSIS — O34219 Maternal care for unspecified type scar from previous cesarean delivery: Secondary | ICD-10-CM | POA: Diagnosis not present

## 2023-07-29 DIAGNOSIS — E669 Obesity, unspecified: Secondary | ICD-10-CM

## 2023-08-05 ENCOUNTER — Other Ambulatory Visit: Payer: Self-pay

## 2023-08-05 ENCOUNTER — Ambulatory Visit: Admitting: Family Medicine

## 2023-08-05 VITALS — BP 120/80 | HR 101 | Wt 262.2 lb

## 2023-08-05 DIAGNOSIS — O99210 Obesity complicating pregnancy, unspecified trimester: Secondary | ICD-10-CM

## 2023-08-05 DIAGNOSIS — Z8759 Personal history of other complications of pregnancy, childbirth and the puerperium: Secondary | ICD-10-CM | POA: Diagnosis not present

## 2023-08-05 DIAGNOSIS — Z98891 History of uterine scar from previous surgery: Secondary | ICD-10-CM

## 2023-08-05 DIAGNOSIS — O0993 Supervision of high risk pregnancy, unspecified, third trimester: Secondary | ICD-10-CM | POA: Diagnosis not present

## 2023-08-05 DIAGNOSIS — O99213 Obesity complicating pregnancy, third trimester: Secondary | ICD-10-CM

## 2023-08-05 DIAGNOSIS — O0990 Supervision of high risk pregnancy, unspecified, unspecified trimester: Secondary | ICD-10-CM

## 2023-08-05 DIAGNOSIS — Z3A33 33 weeks gestation of pregnancy: Secondary | ICD-10-CM

## 2023-08-05 NOTE — Progress Notes (Signed)
   PRENATAL VISIT NOTE  Subjective:  Nonie Gillis [name] is a 28 y.o. G2P1001 at [redacted]w[redacted]d being seen today for ongoing prenatal care.  She is currently monitored for the following issues for this low-risk pregnancy and has Obesity in pregnancy; Asthma; Supervision of high risk pregnancy, antepartum; History of gestational hypertension; History of cesarean delivery; and BMI 40.0-44.9, adult (HCC) on their problem list.  Patient reports no complaints.  Contractions: Not present. Vag. Bleeding: None.  Movement: Present. Denies leaking of fluid.   The following portions of the patient's history were reviewed and updated as appropriate: allergies, current medications, past family history, past medical history, past social history, past surgical history and problem list.   Objective:   Vitals:   08/05/23 0901  BP: 120/80  Pulse: (!) 101  Weight: 262 lb 3.2 oz (118.9 kg)    Fetal Status: Fetal Heart Rate (bpm): 140 Fundal Height: 34 cm Movement: Present     General:  Alert, oriented and cooperative. Patient is in no acute distress.  Skin: Skin is warm and dry. No rash noted.   Cardiovascular: Normal heart rate noted  Respiratory: Normal respiratory effort, no problems with respiration noted  Abdomen: Soft, gravid, appropriate for gestational age.  Pain/Pressure: Absent     Pelvic: Cervical exam deferred        Extremities: Normal range of motion.  Edema: None  Mental Status: Normal mood and affect. Normal behavior. Normal judgment and thought content.   Assessment and Plan:  Pregnancy: G2P1001 at [redacted]w[redacted]d  1. Supervision of high risk pregnancy, antepartum (Primary) Up to date Vigorous movement FH appropriate  2. Obesity in pregnancy TWG=4 lb 3.2 oz (1.905 kg)   3. History of gestational hypertension BP WNL  4. History of cesarean delivery Consented for TOLAC and Considering RCS Reviewed she has time to decide She reports no planned pregnancies in future -- likely IUD  placement at time of delivery She was concerned about risk of uterine rupture and answered concerned about this risk   Preterm labor symptoms and general obstetric precautions including but not limited to vaginal bleeding, contractions, leaking of fluid and fetal movement were reviewed in detail with the patient. Please refer to After Visit Summary for other counseling recommendations.   Return in about 2 weeks (around 08/19/2023) for Routine prenatal care.  Future Appointments  Date Time Provider Department Center  08/11/2023  2:30 PM [facility] US1 [facility]  08/18/2023  9:30 AM [facility] US3 [facility]  08/25/2023  8:15 AM  Bing, MD [facility]  08/25/2023  9:30 AM [facility] US3 [facility]  09/01/2023  2:30 PM [facility] US1 [facility]  09/01/2023  3:55 PM  Bing, MD [facility]    Federico Flake, MD

## 2023-08-11 ENCOUNTER — Ambulatory Visit: Payer: Medicaid Other | Attending: Maternal & Fetal Medicine

## 2023-08-11 DIAGNOSIS — O99213 Obesity complicating pregnancy, third trimester: Secondary | ICD-10-CM | POA: Insufficient documentation

## 2023-08-11 DIAGNOSIS — O34219 Maternal care for unspecified type scar from previous cesarean delivery: Secondary | ICD-10-CM | POA: Diagnosis not present

## 2023-08-11 DIAGNOSIS — E669 Obesity, unspecified: Secondary | ICD-10-CM

## 2023-08-11 DIAGNOSIS — Z3A34 34 weeks gestation of pregnancy: Secondary | ICD-10-CM

## 2023-08-11 DIAGNOSIS — Z8759 Personal history of other complications of pregnancy, childbirth and the puerperium: Secondary | ICD-10-CM | POA: Diagnosis present

## 2023-08-11 DIAGNOSIS — O09293 Supervision of pregnancy with other poor reproductive or obstetric history, third trimester: Secondary | ICD-10-CM

## 2023-08-18 ENCOUNTER — Ambulatory Visit: Attending: Obstetrics and Gynecology | Admitting: *Deleted

## 2023-08-18 ENCOUNTER — Ambulatory Visit: Payer: Medicaid Other

## 2023-08-18 VITALS — BP 107/66

## 2023-08-18 DIAGNOSIS — Z3A35 35 weeks gestation of pregnancy: Secondary | ICD-10-CM | POA: Diagnosis not present

## 2023-08-18 DIAGNOSIS — O99213 Obesity complicating pregnancy, third trimester: Secondary | ICD-10-CM | POA: Insufficient documentation

## 2023-08-18 DIAGNOSIS — E669 Obesity, unspecified: Secondary | ICD-10-CM

## 2023-08-18 NOTE — Procedures (Signed)
 Danielle Padilla [name] 03/28/1996 [redacted]w[redacted]d  Fetus A Non-Stress Test Interpretation for 08/18/23 (NST only)  Indication:  Obesity  Fetal Heart Rate A Mode: External Baseline Rate (A): 140 bpm Variability: Moderate Accelerations: 15 x 15 Decelerations: None Multiple birth?: No  Uterine Activity Mode: Palpation, Toco Contraction Frequency (min): 1 UC Contraction Quality: Mild Resting Tone Palpated: Relaxed Resting Time: Adequate  Interpretation (Fetal Testing) Nonstress Test Interpretation: Reactive Comments: Dr. Grace Bushy reviewed tracing.

## 2023-08-20 ENCOUNTER — Telehealth: Payer: Self-pay

## 2023-08-25 ENCOUNTER — Ambulatory Visit: Payer: Medicaid Other | Attending: Maternal & Fetal Medicine

## 2023-08-25 ENCOUNTER — Other Ambulatory Visit (HOSPITAL_COMMUNITY)
Admission: RE | Admit: 2023-08-25 | Discharge: 2023-08-25 | Disposition: A | Discharge status: Home / Self Care | Source: Ambulatory Visit | Attending: Obstetrics and Gynecology | Admitting: Obstetrics and Gynecology

## 2023-08-25 ENCOUNTER — Ambulatory Visit: Admitting: Obstetrics and Gynecology

## 2023-08-25 ENCOUNTER — Other Ambulatory Visit: Payer: Self-pay | Admitting: *Deleted

## 2023-08-25 ENCOUNTER — Ambulatory Visit (HOSPITAL_BASED_OUTPATIENT_CLINIC_OR_DEPARTMENT_OTHER): Admitting: Obstetrics and Gynecology

## 2023-08-25 ENCOUNTER — Other Ambulatory Visit: Payer: Self-pay

## 2023-08-25 VITALS — BP 120/81 | HR 95 | Wt 261.2 lb

## 2023-08-25 DIAGNOSIS — O99213 Obesity complicating pregnancy, third trimester: Secondary | ICD-10-CM

## 2023-08-25 DIAGNOSIS — Z3493 Encounter for supervision of normal pregnancy, unspecified, third trimester: Secondary | ICD-10-CM | POA: Diagnosis present

## 2023-08-25 DIAGNOSIS — Z1332 Encounter for screening for maternal depression: Secondary | ICD-10-CM

## 2023-08-25 DIAGNOSIS — O09293 Supervision of pregnancy with other poor reproductive or obstetric history, third trimester: Secondary | ICD-10-CM

## 2023-08-25 DIAGNOSIS — Z98891 History of uterine scar from previous surgery: Secondary | ICD-10-CM

## 2023-08-25 DIAGNOSIS — Z3A36 36 weeks gestation of pregnancy: Secondary | ICD-10-CM | POA: Diagnosis present

## 2023-08-25 DIAGNOSIS — O99893 Other specified diseases and conditions complicating puerperium: Secondary | ICD-10-CM

## 2023-08-25 DIAGNOSIS — O99210 Obesity complicating pregnancy, unspecified trimester: Secondary | ICD-10-CM | POA: Diagnosis not present

## 2023-08-25 DIAGNOSIS — O0993 Supervision of high risk pregnancy, unspecified, third trimester: Secondary | ICD-10-CM | POA: Diagnosis not present

## 2023-08-25 DIAGNOSIS — Z6841 Body Mass Index (BMI) 40.0 and over, adult: Secondary | ICD-10-CM | POA: Diagnosis not present

## 2023-08-25 DIAGNOSIS — Z8759 Personal history of other complications of pregnancy, childbirth and the puerperium: Secondary | ICD-10-CM | POA: Insufficient documentation

## 2023-08-25 DIAGNOSIS — O34219 Maternal care for unspecified type scar from previous cesarean delivery: Secondary | ICD-10-CM

## 2023-08-25 DIAGNOSIS — E669 Obesity, unspecified: Secondary | ICD-10-CM

## 2023-08-25 NOTE — Progress Notes (Signed)
 Maternal-Fetal Medicine Consultation Name: Danielle Padilla, [place] MRN: [id number]  G2 P1001 at 36-weeks' gestation. Obstetric history significant for a term cesarean delivery. Pre-gravid BMI 40. Patient does not have gestational diabetes.  Blood pressure today at our office is 120/81 mmHg.  Ultrasound Fetal growth is appropriate for gestational age.  Amniotic fluid is normal good fetal activity seen.  Antenatal testing is reassuring.  BPP 8/8.  Cephalic presentation.  I reassured the patient of the findings. I counseled the patient on the importance of antenatal testing.  Grade 3 obesity is associated with a slight increase in the risk of stillbirth (2.5-to 3-fold).  However, the absolute risk of stillbirth is very small. Patient will discuss with you about mode of delivery.  Recommendations -An appointment was made for her to return in 2 weeks for BPP. -BPP at your office next week.  Consultation including face-to-face (more than 50%) counseling 10 minutes.

## 2023-08-25 NOTE — Progress Notes (Signed)
   PRENATAL VISIT NOTE  Subjective:  Danielle Padilla is a 28 y.o. G2P1001 at [redacted]w[redacted]d being seen today for ongoing prenatal care.  She is currently monitored for the following issues for this high-risk pregnancy and has Obesity in pregnancy; Asthma; Supervision of high risk pregnancy, antepartum; History of gestational hypertension; History of cesarean delivery; and BMI 40.0-44.9, adult (HCC) on their problem list.  Patient reports no complaints.  Contractions: Irritability. Vag. Bleeding: None.  Movement: Present. Denies leaking of fluid.   The following portions of the patient's history were reviewed and updated as appropriate: allergies, current medications, past family history, past medical history, past social history, past surgical history and problem list.   Objective:   Vitals:   08/25/23 0830  BP: 120/81  Pulse: 95  Weight: 261 lb 3.2 oz (118.5 kg)    Fetal Status: Fetal Heart Rate (bpm): 146   Movement: Present     General:  Alert, oriented and cooperative. Patient is in no acute distress.  Skin: Skin is warm and dry. No rash noted.   Cardiovascular: Normal heart rate noted  Respiratory: Normal respiratory effort, no problems with respiration noted  Abdomen: Soft, gravid, appropriate for gestational age.  Pain/Pressure: Present (pressure in pelvis)     Pelvic: Cervical exam performed in the presence of a chaperone Dilation: Closed Effacement (%): Thick Station: Ballotable  Extremities: Normal range of motion.  Edema: None  Mental Status: Normal mood and affect. Normal behavior. Normal judgment and thought content.   Assessment and Plan:  Pregnancy: G2P1001 at [redacted]w[redacted]d 1. [redacted] weeks gestation of pregnancy (Primary) Routine care. Follow up re: [facility] next visit - Cervicovaginal ancillary only - Culture, beta strep (group b only)  2. History of cesarean delivery Patient wondering about delivery mode. Long d/w her re: r/b to tolac and rpt c/s. She doesn't want to be  induced again and is okay with TOLAC if she goes into spontaneous labor. Pt requests 4/30- 5/2. Request sent  3. BMI 40.0-44.9, adult (HCC) Weight stable. Continue with weekly testing today 3/25: ceph, 8/8, afi 14 3/12: 76%, 2158g, ac 92%, ceph  4. History of gestational hypertension  Preterm labor symptoms and general obstetric precautions including but not limited to vaginal bleeding, contractions, leaking of fluid and fetal movement were reviewed in detail with the patient. Please refer to After Visit Summary for other counseling recommendations.   Return in about 1 week (around 09/01/2023) for low risk ob, md or app.  Future Appointments  Date Time Provider Department Center  08/25/2023  9:00 AM [facility] PROVIDER 1 [facility]  08/25/2023  9:30 AM [facility] US3 [facility]  09/01/2023  1:15 PM [facility] US2 [facility]  09/01/2023  3:55 PM [place] Bing, MD [facility]    [place] Bing, MD

## 2023-08-26 ENCOUNTER — Encounter: Payer: Self-pay | Admitting: *Deleted

## 2023-08-26 ENCOUNTER — Telehealth: Payer: Self-pay | Admitting: Family Medicine

## 2023-08-26 ENCOUNTER — Encounter: Payer: Self-pay | Admitting: Obstetrics and Gynecology

## 2023-08-26 LAB — CERVICOVAGINAL ANCILLARY ONLY
Chlamydia: NEGATIVE
Comment: NEGATIVE
Comment: NORMAL
Neisseria Gonorrhea: NEGATIVE

## 2023-08-26 NOTE — Telephone Encounter (Signed)
 I had messaged patient earlier and forwarded her request for moving C/S to scheduler I called patient and she said that is what she was calling about and now understands she is waiting to her from scheduler, no other questions. Nancy Fetter

## 2023-08-26 NOTE — Telephone Encounter (Signed)
 Patient has some questions in regards to her C-Section

## 2023-08-28 ENCOUNTER — Encounter (HOSPITAL_COMMUNITY): Payer: Self-pay

## 2023-08-28 NOTE — Patient Instructions (Signed)
 Danielle Padilla  08/28/2023   Your procedure is scheduled on:  09/16/2023  Arrive at 0730 at Entrance C on [address] at [facility]  and [employer]. You are invited to use the FREE valet parking or use the Visitor's parking deck.  Pick up the phone at the desk and dial [phone].  Call this number if you have problems the morning of surgery: [phone]  Remember:   Do not eat food:(After Midnight) Desps de medianoche.  You may drink clear liquids until arrival at ___0730__.  Clear liquids means a liquid you can see thru.  It can have color such as Cola or Kool aid.  Tea is OK and coffee as long as no milk or creamer of any kind.  Take these medicines the morning of surgery with A SIP OF WATER:  none   Do not wear jewelry, make-up or nail polish.  Do not wear lotions, powders, or perfumes. Do not wear deodorant.  Do not shave 48 hours prior to surgery.  Do not bring valuables to the hospital.  [facility] is not   responsible for any belongings or valuables brought to the hospital.  Contacts, dentures or bridgework may not be worn into surgery.  Leave suitcase in the car. After surgery it may be brought to your room.  For patients admitted to the hospital, checkout time is 11:00 AM the day of              discharge.      Please read over the following fact sheets that you were given:     Preparing for Surgery

## 2023-08-29 LAB — CULTURE, BETA STREP (GROUP B ONLY): Strep Gp B Culture: NEGATIVE

## 2023-08-31 ENCOUNTER — Other Ambulatory Visit: Payer: Self-pay | Admitting: *Deleted

## 2023-08-31 DIAGNOSIS — O0990 Supervision of high risk pregnancy, unspecified, unspecified trimester: Secondary | ICD-10-CM

## 2023-08-31 DIAGNOSIS — O99210 Obesity complicating pregnancy, unspecified trimester: Secondary | ICD-10-CM

## 2023-08-31 DIAGNOSIS — Z8759 Personal history of other complications of pregnancy, childbirth and the puerperium: Secondary | ICD-10-CM

## 2023-08-31 DIAGNOSIS — Z6841 Body Mass Index (BMI) 40.0 and over, adult: Secondary | ICD-10-CM

## 2023-08-31 DIAGNOSIS — J4520 Mild intermittent asthma, uncomplicated: Secondary | ICD-10-CM

## 2023-08-31 DIAGNOSIS — Z98891 History of uterine scar from previous surgery: Secondary | ICD-10-CM

## 2023-09-01 ENCOUNTER — Ambulatory Visit: Payer: Self-pay | Admitting: Obstetrics and Gynecology

## 2023-09-01 ENCOUNTER — Other Ambulatory Visit

## 2023-09-01 ENCOUNTER — Ambulatory Visit: Payer: Medicaid Other

## 2023-09-01 ENCOUNTER — Other Ambulatory Visit: Payer: Self-pay

## 2023-09-01 VITALS — BP 116/80 | HR 76 | Wt 262.4 lb

## 2023-09-01 DIAGNOSIS — O0993 Supervision of high risk pregnancy, unspecified, third trimester: Secondary | ICD-10-CM | POA: Diagnosis not present

## 2023-09-01 DIAGNOSIS — Z98891 History of uterine scar from previous surgery: Secondary | ICD-10-CM

## 2023-09-01 DIAGNOSIS — Z3A37 37 weeks gestation of pregnancy: Secondary | ICD-10-CM | POA: Diagnosis not present

## 2023-09-01 DIAGNOSIS — Z6841 Body Mass Index (BMI) 40.0 and over, adult: Secondary | ICD-10-CM

## 2023-09-01 DIAGNOSIS — O0990 Supervision of high risk pregnancy, unspecified, unspecified trimester: Secondary | ICD-10-CM

## 2023-09-01 DIAGNOSIS — O99210 Obesity complicating pregnancy, unspecified trimester: Secondary | ICD-10-CM

## 2023-09-01 DIAGNOSIS — Z8759 Personal history of other complications of pregnancy, childbirth and the puerperium: Secondary | ICD-10-CM

## 2023-09-01 DIAGNOSIS — J4520 Mild intermittent asthma, uncomplicated: Secondary | ICD-10-CM

## 2023-09-01 DIAGNOSIS — O99213 Obesity complicating pregnancy, third trimester: Secondary | ICD-10-CM

## 2023-09-01 NOTE — Progress Notes (Unsigned)
 Marland Kitchen

## 2023-09-02 NOTE — Progress Notes (Addendum)
   PRENATAL VISIT NOTE  Subjective:  Danielle Padilla is a 28 y.o. G2P1001 at [redacted]w[redacted]d being seen today for ongoing prenatal care.  She is currently monitored for the following issues for this high-risk pregnancy and has Obesity in pregnancy; Asthma; Supervision of high risk pregnancy, antepartum; History of gestational hypertension; History of cesarean delivery; and BMI 40.0-44.9, adult (HCC) on their problem list.  Patient reports no complaints.   . Vag. Bleeding: None.  Movement: Present. Denies leaking of fluid. No UCs   The following portions of the patient's history were reviewed and updated as appropriate: allergies, current medications, past family history, past medical history, past social history, past surgical history and problem list.   Objective:   Vitals:   09/01/23 1536  BP: 116/80  Pulse: 76  Weight: 262 lb 6.4 oz (119 kg)    Fetal Status: Fetal Heart Rate (bpm): 149   Movement: Present     General:  Alert, oriented and cooperative. Patient is in no acute distress.  Skin: Skin is warm and dry. No rash noted.   Cardiovascular: Normal heart rate noted  Respiratory: Normal respiratory effort, no problems with respiration noted  Abdomen: Soft, gravid, appropriate for gestational age.  Pain/Pressure: Present     Pelvic: Cervical exam deferred        Extremities: Normal range of motion.  Edema: None  Mental Status: Normal mood and affect. Normal behavior. Normal judgment and thought content.   Assessment and Plan:  Pregnancy: G2P1001 at [redacted]w[redacted]d 1. [redacted] weeks gestation of pregnancy (Primary) GBS neg  2. History of cesarean delivery 05/2022 PLTCS at 37wks after GHTN IOL; patient requested?  Plans for tolac if goes into labor with scheduled repeat on 4/30; consent already signed  3. BMI 40.0-44.9, adult (HCC) Continue with weekly testing. Bpp 8/8, cephalic, afi 7 today with a 2cm pocket  4. History of gestational hypertension  Term labor symptoms and general  obstetric precautions including but not limited to vaginal bleeding, contractions, leaking of fluid and fetal movement were reviewed in detail with the patient. Please refer to After Visit Summary for other counseling recommendations.   Return in 1 week (on 09/08/2023) for in person, md or app, low risk ob.  Future Appointments  Date Time Provider Department Center  09/08/2023  7:00 AM [facility] PROVIDER 1 [facility]  09/08/2023  7:30 AM [facility] US2 [facility]  09/10/2023  3:15 PM Tresia Fruit, MD [facility]  09/14/2023 10:00 AM [facility]LD PAT 1 [facility]IN[facility] None    Raynell Caller, MD

## 2023-09-08 ENCOUNTER — Other Ambulatory Visit: Payer: Self-pay | Admitting: Obstetrics and Gynecology

## 2023-09-08 ENCOUNTER — Ambulatory Visit: Attending: Obstetrics and Gynecology | Admitting: Obstetrics and Gynecology

## 2023-09-08 ENCOUNTER — Ambulatory Visit (HOSPITAL_BASED_OUTPATIENT_CLINIC_OR_DEPARTMENT_OTHER)

## 2023-09-08 ENCOUNTER — Encounter (HOSPITAL_COMMUNITY): Payer: Self-pay

## 2023-09-08 VITALS — BP 117/70

## 2023-09-08 DIAGNOSIS — O99213 Obesity complicating pregnancy, third trimester: Secondary | ICD-10-CM | POA: Insufficient documentation

## 2023-09-08 DIAGNOSIS — O34219 Maternal care for unspecified type scar from previous cesarean delivery: Secondary | ICD-10-CM

## 2023-09-08 DIAGNOSIS — O09293 Supervision of pregnancy with other poor reproductive or obstetric history, third trimester: Secondary | ICD-10-CM

## 2023-09-08 DIAGNOSIS — Z3A38 38 weeks gestation of pregnancy: Secondary | ICD-10-CM

## 2023-09-08 DIAGNOSIS — O0990 Supervision of high risk pregnancy, unspecified, unspecified trimester: Secondary | ICD-10-CM

## 2023-09-08 DIAGNOSIS — E669 Obesity, unspecified: Secondary | ICD-10-CM | POA: Diagnosis not present

## 2023-09-08 NOTE — Patient Instructions (Signed)
 Danielle Padilla [name]  09/08/2023   Your procedure is scheduled on:  09/18/2023  Arrive at 0800 at Entrance C on [address] at [facility]  and [employer]. You are invited to use the FREE valet parking or use the Visitor's parking deck.  Pick up the phone at the desk and dial [phone].  Call this number if you have problems the morning of surgery: [phone]  Remember:   Do not eat food:(After Midnight) Desps de medianoche.  You may drink clear liquids until arrival at ___0800__.  Clear liquids means a liquid you can see thru.  It can have color such as Cola or Kool aid.  Tea is OK and coffee as long as no milk or creamer of any kind.  Take these medicines the morning of surgery with A SIP OF WATER :  none   Do not wear jewelry, make-up or nail polish.  Do not wear lotions, powders, or perfumes. Do not wear deodorant.  Do not shave 48 hours prior to surgery.  Do not bring valuables to the hospital.  [facility] is not   responsible for any belongings or valuables brought to the hospital.  Contacts, dentures or bridgework may not be worn into surgery.  Leave suitcase in the car. After surgery it may be brought to your room.  For patients admitted to the hospital, checkout time is 11:00 AM the day of              discharge.      Please read over the following fact sheets that you were given:     Preparing for Surgery

## 2023-09-08 NOTE — Progress Notes (Signed)
 Maternal-Fetal Medicine Consultation Name: Danielle Padilla, Danielle Padilla MRN: [id number]  G2 P1002 at 38-weeks' gestation.  Patient is here for fetal growth assessment. She does not have gestational diabetes.  Blood pressure today at our office is 117/70 mmHg.  Ultrasound Fetal growth is appropriate for gestational age.  Amniotic fluid is normal good fetal activity seen.  Cephalic presentation.  Placenta is posterior and there is no evidence of previa or placenta accreta spectrum.  Patient is planning to undergo repeat cesarean delivery next week.  Cesarean section was performed after induction of labor for gestational hypertension and for failure to progress in labor. I discussed the benefits and risks of VBAC.  Vaginal delivery is associated with 1% scar dehiscence.  Repeat cesarean deliveries increase the risk of hemorrhage, infection, venous thromboembolism and placenta previa/placenta accreta spectrum. According to maternal-fetal medicine unit network calculation, the likelihood of successful vaginal delivery is 40%.  Patient informed that she will not be changing her decision to undergo cesarean delivery.  She had questions about IUD.  I asked her to discuss with her provider about insertion of IUD during cesarean delivery.  Repeat cesarean delivery on 09/16/23.  Consultation including face-to-face (more than 50%) counseling 10 minutes.

## 2023-09-10 ENCOUNTER — Other Ambulatory Visit: Payer: Self-pay

## 2023-09-10 ENCOUNTER — Ambulatory Visit: Admitting: Obstetrics & Gynecology

## 2023-09-10 VITALS — BP 105/74 | HR 93 | Wt 262.6 lb

## 2023-09-10 DIAGNOSIS — Z8759 Personal history of other complications of pregnancy, childbirth and the puerperium: Secondary | ICD-10-CM | POA: Diagnosis not present

## 2023-09-10 DIAGNOSIS — O0993 Supervision of high risk pregnancy, unspecified, third trimester: Secondary | ICD-10-CM

## 2023-09-10 DIAGNOSIS — O0990 Supervision of high risk pregnancy, unspecified, unspecified trimester: Secondary | ICD-10-CM

## 2023-09-10 DIAGNOSIS — Z98891 History of uterine scar from previous surgery: Secondary | ICD-10-CM | POA: Diagnosis not present

## 2023-09-10 DIAGNOSIS — Z3A38 38 weeks gestation of pregnancy: Secondary | ICD-10-CM

## 2023-09-10 DIAGNOSIS — Z6841 Body Mass Index (BMI) 40.0 and over, adult: Secondary | ICD-10-CM | POA: Diagnosis not present

## 2023-09-10 NOTE — Progress Notes (Signed)
   PRENATAL VISIT NOTE  Subjective:  Danielle Padilla is a 28 y.o. G2P1001 at [redacted]w[redacted]d being seen today for ongoing prenatal care. She is currently monitored for the following issues for this high-risk pregnancy and has Obesity in pregnancy; Asthma; Supervision of high risk pregnancy, antepartum; History of gestational hypertension; History of cesarean delivery; and BMI 40.0-44.9, adult (HCC) on their problem list.  Patient reports no complaints.  Contractions: Not present.  . Movement: Present. Denies leaking of fluid.   The following portions of the patient's history were reviewed and updated as appropriate: allergies, current medications, past family history, past medical history, past social history, past surgical history and problem list.   Objective:   Vitals:   09/10/23 1532  BP: 105/74  Pulse: 93  Weight: 262 lb 9.6 oz (119.1 kg)    Fetal Status: Fetal Heart Rate (bpm): 162   Movement: Present     General:  Alert, oriented and cooperative. Patient is in no acute distress.  Skin: Skin is warm and dry. No rash noted.   Cardiovascular: Normal heart rate noted  Respiratory: Normal respiratory effort, no problems with respiration noted  Abdomen: Soft, gravid, appropriate for gestational age.  Pain/Pressure: Absent     Pelvic: No pelvic exam was performed at this visit.        Extremities: Normal range of motion.  Edema: None  Mental Status: Normal mood and affect. Normal behavior. Normal judgment and thought content.   Assessment and Plan:  Pregnancy: G2P1001 at [redacted]w[redacted]d 1. History of gestational hypertension (Primary) - Patient inquired about discontinuing low-dose aspirin . She was counseled that it is appropriate to stop a few days prior to her scheduled c-section.  2. BMI 40.0-44.9, adult (HCC)  3. History of cesarean delivery  4. Supervision of high risk pregnancy, antepartum - Patient is scheduled for a repeat c-section next week due to high-risk pregnancy,  with appropriate preoperative planning and coordination in place.   Term labor symptoms and general obstetric precautions including but not limited to vaginal bleeding, contractions, leaking of fluid and fetal movement were reviewed in detail with the patient. Please refer to After Visit Summary for other counseling recommendations.   No follow-ups on file.  Future Appointments  Date Time Provider Department Center  09/16/2023 10:00 AM [facility] PAT 1 [facility] None    Kathryn Parish, Medical Student  Attestation of Attending Supervision of Medical Student: Evaluation and management procedures were performed by the medical student under my supervision and collaboration.  I have reviewed the student's note and chart, and I agree with the management and plan.  Onnie Bilis, MD, FACOG Attending Obstetrician & Gynecologist Faculty Practice, [facility]

## 2023-09-14 ENCOUNTER — Encounter (HOSPITAL_COMMUNITY)
Admission: RE | Admit: 2023-09-14 | Discharge: 2023-09-14 | Disposition: A | Discharge status: Home / Self Care | Source: Ambulatory Visit | Attending: Family Medicine | Admitting: Family Medicine

## 2023-09-16 ENCOUNTER — Encounter (HOSPITAL_COMMUNITY)
Admission: RE | Admit: 2023-09-16 | Discharge: 2023-09-16 | Disposition: A | Discharge status: Home / Self Care | Source: Ambulatory Visit | Attending: Obstetrics and Gynecology | Admitting: Obstetrics and Gynecology

## 2023-09-16 DIAGNOSIS — Z98891 History of uterine scar from previous surgery: Secondary | ICD-10-CM

## 2023-09-16 DIAGNOSIS — Z01812 Encounter for preprocedural laboratory examination: Secondary | ICD-10-CM | POA: Insufficient documentation

## 2023-09-16 LAB — CBC
HCT: 33.6 % — ABNORMAL LOW (ref 36.0–46.0)
Hemoglobin: 11.5 g/dL — ABNORMAL LOW (ref 12.0–15.0)
MCH: 29 pg (ref 26.0–34.0)
MCHC: 34.2 g/dL (ref 30.0–36.0)
MCV: 84.8 fL (ref 80.0–100.0)
Platelets: 277 10*3/uL (ref 150–400)
RBC: 3.96 MIL/uL (ref 3.87–5.11)
RDW: 12.7 % (ref 11.5–15.5)
WBC: 7 10*3/uL (ref 4.0–10.5)
nRBC: 0 % (ref 0.0–0.2)

## 2023-09-16 LAB — COMPREHENSIVE METABOLIC PANEL WITH GFR
ALT: 20 U/L (ref 0–44)
AST: 18 U/L (ref 15–41)
Albumin: 3.2 g/dL — ABNORMAL LOW (ref 3.5–5.0)
Alkaline Phosphatase: 98 U/L (ref 38–126)
Anion gap: 9 (ref 5–15)
BUN: 6 mg/dL (ref 6–20)
CO2: 22 mmol/L (ref 22–32)
Calcium: 9.4 mg/dL (ref 8.9–10.3)
Chloride: 104 mmol/L (ref 98–111)
Creatinine, Ser: 0.73 mg/dL (ref 0.44–1.00)
GFR, Estimated: >60 mL/min (ref >60)
Glucose, Bld: 92 mg/dL (ref 70–99)
Potassium: 3.8 mmol/L (ref 3.5–5.1)
Sodium: 135 mmol/L (ref 135–145)
Total Bilirubin: 0.7 mg/dL (ref 0.0–1.2)
Total Protein: 6.9 g/dL (ref 6.5–8.1)

## 2023-09-16 LAB — TYPE AND SCREEN
ABO/RH(D): O POS
Antibody Screen: NEGATIVE

## 2023-09-16 LAB — RPR: RPR Ser Ql: NONREACTIVE

## 2023-09-17 ENCOUNTER — Encounter: Admitting: Obstetrics & Gynecology

## 2023-09-17 NOTE — Anesthesia Preprocedure Evaluation (Addendum)
 Anesthesia Evaluation  Patient identified by MRN, date of birth, ID band Patient awake    Reviewed: Allergy & Precautions, NPO status , Patient's Chart, lab work & pertinent test results  History of Anesthesia Complications Negative for: history of anesthetic complications  Airway Mallampati: II  TM Distance: >3 FB Neck ROM: Full    Dental no notable dental hx.    Pulmonary asthma    Pulmonary exam normal        Cardiovascular hypertension, Normal cardiovascular exam     Neuro/Psych negative neurological ROS     GI/Hepatic negative GI ROS, Neg liver ROS,,,  Endo/Other  negative endocrine ROS    Renal/GU negative Renal ROS  negative genitourinary   Musculoskeletal negative musculoskeletal ROS (+)    Abdominal   Peds  Hematology negative hematology ROS (+)   Anesthesia Other Findings Day of surgery medications reviewed with patient.  Reproductive/Obstetrics (+) Pregnancy (Hx of C/S x1)                              Anesthesia Physical Anesthesia Plan  ASA: 2  Anesthesia Plan: Spinal   Post-op Pain Management: Ofirmev  IV (intra-op)*   Induction:   PONV Risk Score and Plan: 4 or greater and Treatment may vary due to age or medical condition, Ondansetron  and Dexamethasone   Airway Management Planned: Natural Airway  Additional Equipment: None  Intra-op Plan:   Post-operative Plan:   Informed Consent: I have reviewed the patients History and Physical, chart, labs and discussed the procedure including the risks, benefits and alternatives for the proposed anesthesia with the patient or authorized representative who has indicated his/her understanding and acceptance.       Plan Discussed with: CRNA  Anesthesia Plan Comments:         Anesthesia Quick Evaluation

## 2023-09-18 ENCOUNTER — Encounter (HOSPITAL_COMMUNITY): Payer: Self-pay | Admitting: Family Medicine

## 2023-09-18 ENCOUNTER — Other Ambulatory Visit: Payer: Self-pay

## 2023-09-18 ENCOUNTER — Inpatient Hospital Stay (HOSPITAL_COMMUNITY)
Admission: RE | Admit: 2023-09-18 | Discharge: 2023-09-20 | DRG: 788 | Disposition: A | Discharge status: Home / Self Care | Attending: Family Medicine | Admitting: Family Medicine

## 2023-09-18 ENCOUNTER — Encounter (HOSPITAL_COMMUNITY)
Admission: RE | Disposition: A | Discharge status: Home / Self Care | Payer: Self-pay | Source: Home / Self Care | Attending: Family Medicine

## 2023-09-18 ENCOUNTER — Inpatient Hospital Stay (HOSPITAL_COMMUNITY): Payer: Self-pay | Admitting: Anesthesiology

## 2023-09-18 DIAGNOSIS — O34211 Maternal care for low transverse scar from previous cesarean delivery: Principal | ICD-10-CM | POA: Diagnosis present

## 2023-09-18 DIAGNOSIS — Z98891 History of uterine scar from previous surgery: Principal | ICD-10-CM

## 2023-09-18 DIAGNOSIS — Z7982 Long term (current) use of aspirin: Secondary | ICD-10-CM

## 2023-09-18 DIAGNOSIS — Z8249 Family history of ischemic heart disease and other diseases of the circulatory system: Secondary | ICD-10-CM

## 2023-09-18 DIAGNOSIS — Z833 Family history of diabetes mellitus: Secondary | ICD-10-CM

## 2023-09-18 DIAGNOSIS — Z3A39 39 weeks gestation of pregnancy: Secondary | ICD-10-CM

## 2023-09-18 SURGERY — Surgical Case
Anesthesia: Spinal

## 2023-09-18 MED ORDER — OXYTOCIN-SODIUM CHLORIDE 30-0.9 UT/500ML-% IV SOLN
INTRAVENOUS | Status: AC
  Filled 2023-09-18: qty 500

## 2023-09-18 MED ORDER — ONDANSETRON HCL 4 MG/2ML IJ SOLN
INTRAMUSCULAR | Status: AC
  Filled 2023-09-18: qty 2

## 2023-09-18 MED ORDER — CEFAZOLIN SODIUM-DEXTROSE 3-4 GM/150ML-% IV SOLN
3.0000 g | INTRAVENOUS | Status: AC
  Administered 2023-09-18: 3 g via INTRAVENOUS

## 2023-09-18 MED ORDER — PRENATAL MULTIVITAMIN CH
1.0000 | ORAL_TABLET | Freq: Every day | ORAL | Status: DC
  Administered 2023-09-19: 1 via ORAL
  Filled 2023-09-18: qty 1

## 2023-09-18 MED ORDER — CEFAZOLIN SODIUM-DEXTROSE 3-4 GM/150ML-% IV SOLN
INTRAVENOUS | Status: AC
  Filled 2023-09-18: qty 150

## 2023-09-18 MED ORDER — TRANEXAMIC ACID-NACL 1000-0.7 MG/100ML-% IV SOLN
INTRAVENOUS | Status: AC
  Filled 2023-09-18: qty 100

## 2023-09-18 MED ORDER — ENOXAPARIN SODIUM 60 MG/0.6ML IJ SOSY
60.0000 mg | PREFILLED_SYRINGE | INTRAMUSCULAR | Status: DC
  Administered 2023-09-19: 60 mg via SUBCUTANEOUS
  Filled 2023-09-18 (×2): qty 0.6

## 2023-09-18 MED ORDER — SODIUM CHLORIDE 0.9% FLUSH: 3.0000 mL | INTRAVENOUS | Status: DC | PRN

## 2023-09-18 MED ORDER — FENTANYL CITRATE (PF) 100 MCG/2ML IJ SOLN: 25.0000 ug | INTRAMUSCULAR | Status: DC | PRN

## 2023-09-18 MED ORDER — ACETAMINOPHEN 10 MG/ML IV SOLN
INTRAVENOUS | Status: AC
  Filled 2023-09-18: qty 100

## 2023-09-18 MED ORDER — FENTANYL CITRATE (PF) 100 MCG/2ML IJ SOLN
INTRAMUSCULAR | Status: AC
  Filled 2023-09-18: qty 2

## 2023-09-18 MED ORDER — SOD CITRATE-CITRIC ACID 500-334 MG/5ML PO SOLN
ORAL | Status: AC
  Filled 2023-09-18: qty 30

## 2023-09-18 MED ORDER — KETOROLAC TROMETHAMINE 30 MG/ML IJ SOLN: 30.0000 mg | Freq: Four times a day (QID) | INTRAMUSCULAR | Status: AC | PRN

## 2023-09-18 MED ORDER — ONDANSETRON HCL 4 MG/2ML IJ SOLN: 4.0000 mg | Freq: Three times a day (TID) | INTRAMUSCULAR | Status: DC | PRN

## 2023-09-18 MED ORDER — OXYCODONE HCL 5 MG PO TABS: 5.0000 mg | ORAL_TABLET | ORAL | Status: DC | PRN

## 2023-09-18 MED ORDER — WITCH HAZEL-GLYCERIN EX PADS: 1.0000 | MEDICATED_PAD | CUTANEOUS | Status: DC | PRN

## 2023-09-18 MED ORDER — ZOLPIDEM TARTRATE 5 MG PO TABS: 5.0000 mg | ORAL_TABLET | Freq: Every evening | ORAL | Status: DC | PRN

## 2023-09-18 MED ORDER — KETOROLAC TROMETHAMINE 30 MG/ML IJ SOLN
30.0000 mg | Freq: Once | INTRAMUSCULAR | Status: AC
  Administered 2023-09-18: 30 mg via INTRAVENOUS

## 2023-09-18 MED ORDER — ACETAMINOPHEN 10 MG/ML IV SOLN
INTRAVENOUS | Status: DC | PRN
  Administered 2023-09-18: 1000 mg via INTRAVENOUS

## 2023-09-18 MED ORDER — DEXAMETHASONE SODIUM PHOSPHATE 10 MG/ML IJ SOLN
INTRAMUSCULAR | Status: AC
  Filled 2023-09-18: qty 1

## 2023-09-18 MED ORDER — PHENYLEPHRINE HCL-NACL 20-0.9 MG/250ML-% IV SOLN
INTRAVENOUS | Status: AC
  Filled 2023-09-18: qty 250

## 2023-09-18 MED ORDER — STERILE WATER FOR IRRIGATION IR SOLN
Status: DC | PRN
  Administered 2023-09-18: 1000 mL

## 2023-09-18 MED ORDER — NALOXONE HCL 0.4 MG/ML IJ SOLN: 0.4000 mg | INTRAMUSCULAR | Status: DC | PRN

## 2023-09-18 MED ORDER — SODIUM CHLORIDE 0.9 % IR SOLN
Status: DC | PRN
  Administered 2023-09-18: 1

## 2023-09-18 MED ORDER — DROPERIDOL 2.5 MG/ML IJ SOLN: 0.6250 mg | Freq: Once | INTRAMUSCULAR | Status: DC | PRN

## 2023-09-18 MED ORDER — ACETAMINOPHEN 500 MG PO TABS
1000.0000 mg | ORAL_TABLET | Freq: Four times a day (QID) | ORAL | Status: DC
  Administered 2023-09-18 – 2023-09-20 (×7): 1000 mg via ORAL
  Filled 2023-09-18 (×7): qty 2

## 2023-09-18 MED ORDER — SOD CITRATE-CITRIC ACID 500-334 MG/5ML PO SOLN
30.0000 mL | ORAL | Status: AC
  Administered 2023-09-18: 30 mL via ORAL

## 2023-09-18 MED ORDER — ONDANSETRON HCL 4 MG/2ML IJ SOLN
INTRAMUSCULAR | Status: DC | PRN
Start: 2023-09-18 — End: 2023-09-18
  Administered 2023-09-18: 4 mg via INTRAVENOUS

## 2023-09-18 MED ORDER — OXYTOCIN-SODIUM CHLORIDE 30-0.9 UT/500ML-% IV SOLN
INTRAVENOUS | Status: DC | PRN
  Administered 2023-09-18 (×2): 30 [IU] via INTRAVENOUS

## 2023-09-18 MED ORDER — ACETAMINOPHEN 500 MG PO TABS: 1000.0000 mg | ORAL_TABLET | Freq: Four times a day (QID) | ORAL | Status: DC

## 2023-09-18 MED ORDER — PHENYLEPHRINE 80 MCG/ML (10ML) SYRINGE FOR IV PUSH (FOR BLOOD PRESSURE SUPPORT)
PREFILLED_SYRINGE | INTRAVENOUS | Status: DC | PRN
Start: 2023-09-18 — End: 2023-09-18
  Administered 2023-09-18: 80 ug via INTRAVENOUS

## 2023-09-18 MED ORDER — DIPHENHYDRAMINE HCL 25 MG PO CAPS: 25.0000 mg | ORAL_CAPSULE | ORAL | Status: DC | PRN

## 2023-09-18 MED ORDER — SIMETHICONE 80 MG PO CHEW
80.0000 mg | CHEWABLE_TABLET | Freq: Three times a day (TID) | ORAL | Status: DC
  Administered 2023-09-18 – 2023-09-19 (×4): 80 mg via ORAL
  Filled 2023-09-18 (×5): qty 1

## 2023-09-18 MED ORDER — DIPHENHYDRAMINE HCL 25 MG PO CAPS: 25.0000 mg | ORAL_CAPSULE | Freq: Four times a day (QID) | ORAL | Status: DC | PRN

## 2023-09-18 MED ORDER — DIPHENHYDRAMINE HCL 50 MG/ML IJ SOLN: 12.5000 mg | INTRAMUSCULAR | Status: DC | PRN

## 2023-09-18 MED ORDER — KETOROLAC TROMETHAMINE 30 MG/ML IJ SOLN
INTRAMUSCULAR | Status: AC
  Filled 2023-09-18: qty 1

## 2023-09-18 MED ORDER — LACTATED RINGERS IV SOLN: INTRAVENOUS | Status: DC

## 2023-09-18 MED ORDER — DIBUCAINE (PERIANAL) 1 % EX OINT: 1.0000 | TOPICAL_OINTMENT | CUTANEOUS | Status: DC | PRN

## 2023-09-18 MED ORDER — DEXAMETHASONE SODIUM PHOSPHATE 10 MG/ML IJ SOLN
INTRAMUSCULAR | Status: DC | PRN
  Administered 2023-09-18: 5 mg via INTRAVENOUS

## 2023-09-18 MED ORDER — SENNOSIDES-DOCUSATE SODIUM 8.6-50 MG PO TABS
2.0000 | ORAL_TABLET | ORAL | Status: DC
  Administered 2023-09-19: 2 via ORAL
  Filled 2023-09-18: qty 2

## 2023-09-18 MED ORDER — MORPHINE SULFATE (PF) 0.5 MG/ML IJ SOLN
INTRAMUSCULAR | Status: AC
  Filled 2023-09-18: qty 10

## 2023-09-18 MED ORDER — FENTANYL CITRATE (PF) 100 MCG/2ML IJ SOLN
INTRAMUSCULAR | Status: DC | PRN
  Administered 2023-09-18: 15 ug via INTRATHECAL

## 2023-09-18 MED ORDER — SIMETHICONE 80 MG PO CHEW: 80.0000 mg | CHEWABLE_TABLET | ORAL | Status: DC | PRN

## 2023-09-18 MED ORDER — MEASLES, MUMPS & RUBELLA VAC IJ SOLR
0.5000 mL | Freq: Once | INTRAMUSCULAR | Status: DC
Start: 2023-09-19 — End: 2023-09-20

## 2023-09-18 MED ORDER — NALOXONE HCL 4 MG/10ML IJ SOLN: 1.0000 ug/kg/h | INTRAVENOUS | Status: DC | PRN

## 2023-09-18 MED ORDER — COCONUT OIL OIL: 1.0000 | TOPICAL_OIL | Status: DC | PRN

## 2023-09-18 MED ORDER — MENTHOL 3 MG MT LOZG: 1.0000 | LOZENGE | OROMUCOSAL | Status: DC | PRN

## 2023-09-18 MED ORDER — BUPIVACAINE IN DEXTROSE 0.75-8.25 % IT SOLN
INTRATHECAL | Status: DC | PRN
  Administered 2023-09-18: 1.7 mL via INTRATHECAL

## 2023-09-18 MED ORDER — PHENYLEPHRINE HCL-NACL 20-0.9 MG/250ML-% IV SOLN
INTRAVENOUS | Status: DC | PRN
  Administered 2023-09-18: 60 ug/min via INTRAVENOUS

## 2023-09-18 MED ORDER — MORPHINE SULFATE (PF) 0.5 MG/ML IJ SOLN
INTRAMUSCULAR | Status: DC | PRN
  Administered 2023-09-18: 150 ug via INTRATHECAL

## 2023-09-18 MED ORDER — IBUPROFEN 600 MG PO TABS
600.0000 mg | ORAL_TABLET | Freq: Four times a day (QID) | ORAL | Status: DC
  Administered 2023-09-18 – 2023-09-20 (×7): 600 mg via ORAL
  Filled 2023-09-18 (×7): qty 1

## 2023-09-18 MED ORDER — TRANEXAMIC ACID-NACL 1000-0.7 MG/100ML-% IV SOLN
INTRAVENOUS | Status: DC | PRN
  Administered 2023-09-18: 1000 mg via INTRAVENOUS

## 2023-09-18 MED ORDER — POVIDONE-IODINE 10 % EX SWAB
2.0000 | Freq: Once | CUTANEOUS | Status: AC
  Administered 2023-09-18: 2 via TOPICAL

## 2023-09-18 MED ORDER — OXYTOCIN-SODIUM CHLORIDE 30-0.9 UT/500ML-% IV SOLN: 2.5000 [IU]/h | INTRAVENOUS | Status: AC

## 2023-09-18 SURGICAL SUPPLY — 28 items
BENZOIN TINCTURE PRP APPL 2/3 (GAUZE/BANDAGES/DRESSINGS) ×2 IMPLANT
CHLORAPREP W/TINT 26 (MISCELLANEOUS) ×4 IMPLANT
CLAMP UMBILICAL CORD (MISCELLANEOUS) ×2 IMPLANT
CLOTH BEACON ORANGE TIMEOUT ST (SAFETY) ×2 IMPLANT
DRSG OPSITE POSTOP 4X10 (GAUZE/BANDAGES/DRESSINGS) ×2 IMPLANT
ELECTRODE REM PT RTRN 9FT ADLT (ELECTROSURGICAL) ×2 IMPLANT
EXTRACTOR VACUUM M CUP 4 TUBE (SUCTIONS) IMPLANT
GLOVE BIOGEL PI IND STRL 7.0 (GLOVE) ×4 IMPLANT
GLOVE BIOGEL PI IND STRL 7.5 (GLOVE) ×4 IMPLANT
GLOVE ECLIPSE 7.5 STRL STRAW (GLOVE) ×2 IMPLANT
GOWN STRL REUS W/TWL LRG LVL3 (GOWN DISPOSABLE) ×6 IMPLANT
KIT ABG SYR 3ML LUER SLIP (SYRINGE) IMPLANT
MAT PREVALON FULL STRYKER (MISCELLANEOUS) IMPLANT
NDL HYPO 25X5/8 SAFETYGLIDE (NEEDLE) IMPLANT
NEEDLE HYPO 25X5/8 SAFETYGLIDE (NEEDLE) IMPLANT
NS IRRIG 1000ML POUR BTL (IV SOLUTION) ×2 IMPLANT
PACK C SECTION WH (CUSTOM PROCEDURE TRAY) ×2 IMPLANT
PAD OB MATERNITY 4.3X12.25 (PERSONAL CARE ITEMS) ×2 IMPLANT
RETRACTOR TRAXI PANNICULUS (MISCELLANEOUS) IMPLANT
RTRCTR C-SECT PINK 25CM LRG (MISCELLANEOUS) ×2 IMPLANT
STRIP CLOSURE SKIN 1/2X4 (GAUZE/BANDAGES/DRESSINGS) ×2 IMPLANT
SUT PLAIN ABS 2-0 CT1 27XMFL (SUTURE) IMPLANT
SUT VIC AB 0 CT1 36 (SUTURE) ×6 IMPLANT
SUT VIC AB 2-0 CT1 TAPERPNT 27 (SUTURE) ×2 IMPLANT
SUT VIC AB 4-0 KS 27 (SUTURE) ×2 IMPLANT
TOWEL OR 17X24 6PK STRL BLUE (TOWEL DISPOSABLE) ×2 IMPLANT
TRAY FOLEY W/BAG SLVR 14FR LF (SET/KITS/TRAYS/PACK) ×2 IMPLANT
WATER STERILE IRR 1000ML POUR (IV SOLUTION) ×2 IMPLANT

## 2023-09-18 NOTE — Discharge Summary (Signed)
 Postpartum Discharge Summary  Date of Service updated***     Patient Name: Danielle Padilla DOB: 1995-06-22 MRN: [id number]  Date of admission: 09/18/2023 Delivery date:09/18/2023 Delivering provider: CHUBB, CASEY C Date of discharge: 09/18/2023  Admitting diagnosis: History of cesarean delivery [Z98.891] Intrauterine pregnancy: [redacted]w[redacted]d     Secondary diagnosis:  Principal Problem:   History of cesarean delivery  Additional problems: ***    Discharge diagnosis: Term Pregnancy Delivered                                              Post partum procedures:{Postpartum procedures:23558} Augmentation: N/A Complications: {OB Labor/Delivery Complications:20784}  Hospital course: Sceduled C/S   28 y.o. yo G2P2002 at [redacted]w[redacted]d was admitted to the hospital 09/18/2023 for scheduled cesarean section with the following indication:Elective Repeat.Delivery details are as follows:  Membrane Rupture Time/Date: 10:18 AM,09/18/2023  Delivery Method:C-Section, Low Transverse Operative Delivery: vacuum-assistance Details of operation can be found in separate operative note.  Patient had a postpartum course complicated by***.  She is ambulating, tolerating a regular diet, passing flatus, and urinating well. Patient is discharged home in stable condition on  09/18/23        Newborn Data: Birth date:09/18/2023 Birth time:10:19 AM Gender:Female Living status:Living Apgars:8 ,9  Weight:3640 g    Magnesium Sulfate received: No BMZ received: No Rhophylac:N/A MMR:{MMR:30440033} T-DaP:{Tdap:23962} Flu: {WUJ:81191} RSV Vaccine received: {RSV:31013} Transfusion:{Transfusion received:30440034}  Immunizations received: Immunization History  Administered Date(s) Administered   Influenza Inj Mdck Quad Pf 02/25/2022   Influenza-Unspecified 02/10/2018   Tdap 03/26/2022, 06/23/2023    Physical exam  Vitals:   09/18/23 0833 09/18/23 0845  BP:  118/79  Pulse:  91  Resp:  16  Temp:  98.1 F (36.7  C)  TempSrc: Oral Oral  SpO2:  98%  Weight: 119.4 kg   Height: 5\' 9"  (1.753 m)    General: {Exam; general:21111117} Lochia: {Desc; appropriate/inappropriate:30686::"appropriate"} Uterine Fundus: {Desc; firm/soft:30687} Incision: {Exam; incision:21111123} DVT Evaluation: {Exam; dvt:2111122} Labs: Lab Results  Component Value Date   WBC 7.0 09/16/2023   HGB 11.5 (L) 09/16/2023   HCT 33.6 (L) 09/16/2023   MCV 84.8 09/16/2023   PLT 277 09/16/2023      Latest Ref Rng & Units 09/16/2023    9:58 AM  CMP  Glucose 70 - 99 mg/dL 92   BUN 6 - 20 mg/dL 6   Creatinine 4.78 - 2.95 mg/dL 6.21   Sodium 308 - 657 mmol/L 135   Potassium 3.5 - 5.1 mmol/L 3.8   Chloride 98 - 111 mmol/L 104   CO2 22 - 32 mmol/L 22   Calcium  8.9 - 10.3 mg/dL 9.4   Total Protein 6.5 - 8.1 g/dL 6.9   Total Bilirubin 0.0 - 1.2 mg/dL 0.7   Alkaline Phos 38 - 126 U/L 98   AST 15 - 41 U/L 18   ALT 0 - 44 U/L 20    Edinburgh Score:    06/07/2022   12:45 PM  Edinburgh Postnatal Depression Scale Screening Tool  I have been able to laugh and see the funny side of things. 0  I have looked forward with enjoyment to things. 0  I have blamed myself unnecessarily when things went wrong. 0  I have been anxious or worried for no good reason. 1  I have felt scared or panicky for no good reason. 0  Things have been getting on top of me. 0  I have been so unhappy that I have had difficulty sleeping. 0  I have felt sad or miserable. 0  I have been so unhappy that I have been crying. 0  The thought of harming myself has occurred to me. 0  Edinburgh Postnatal Depression Scale Total 1   No data recorded  After visit meds:  Allergies as of 09/18/2023   No Known Allergies   Med Rec must be completed prior to using this [facility]***        Discharge home in stable condition Infant Feeding: Bottle Infant Disposition:{CHL IP OB HOME WITH MVHQIO:96295} Discharge instruction: per After Visit Summary and Postpartum  booklet. Activity: Advance as tolerated. Pelvic rest for 6 weeks.  Diet: {OB MWUX:32440102} Future Appointments:No future appointments. Follow up Visit:   Please schedule this patient for a {Visit type:23955} postpartum visit in {Postpartum visit:23953} with the following provider: {Provider type:23954}. Additional Postpartum F/U:{PP Procedure:23957}  {Risk level:23960} pregnancy complicated by: {complication:23959} Delivery mode:  C-Section, Low Transverse Anticipated Birth Control:  {Birth Control:23956}   09/18/2023 [name], MD

## 2023-09-18 NOTE — Anesthesia Procedure Notes (Signed)
 Spinal  Patient location during procedure: OR Start time: 09/18/2023 9:50 AM End time: 09/18/2023 9:53 AM Reason for block: surgical anesthesia Staffing Performed: anesthesiologist  Anesthesiologist: Vernadine Golas, MD Performed by: Vernadine Golas, MD Authorized by: Vernadine Golas, MD   Preanesthetic Checklist Completed: patient identified, IV checked, risks and benefits discussed, monitors and equipment checked, pre-op evaluation and timeout performed Spinal Block Patient position: sitting Prep: DuraPrep and site prepped and draped Patient monitoring: heart rate, continuous pulse ox and blood pressure Approach: midline Location: L3-4 Injection technique: single-shot Needle Needle type: Pencan  Needle gauge: 24 G Needle length: 10 cm Assessment Sensory level: T4 Events: CSF return Additional Notes Risks, benefits, and alternative discussed. Patient gave consent to procedure. Prepped and draped in sitting position. Clear CSF obtained on first attempt. Positive terminal aspiration. No pain or paraesthesias with injection. Patient tolerated procedure well. Vital signs stable. Amador Junes, MD

## 2023-09-18 NOTE — H&P (Signed)
 LABOR AND DELIVERY ADMISSION HISTORY AND PHYSICAL NOTE  Danielle Padilla is a 28 y.o. female G2P1001 with IUP at [redacted]w[redacted]d by 11 wk u/s presenting for elective repeat cesarean section.   She reports positive fetal movement. She denies leakage of fluid or vaginal bleeding.  Prenatal History/Complications:  Past Medical History: Past Medical History:  Diagnosis Date   Asthma    Keloid 03/03/2023   PCOS (polycystic ovarian syndrome)    Polycystic ovary syndrome 07/19/2018   early GTT     Pregnancy induced hypertension    Pregnancy-induced hypertension 03/03/2023    Past Surgical History: Past Surgical History:  Procedure Laterality Date   CESAREAN SECTION N/A 06/06/2022   Procedure: CESAREAN SECTION;  Surgeon: Danielle Box, MD;  Location: [facility] LD ORS;  Service: Obstetrics;  Laterality: N/A;   EYE SURGERY      Obstetrical History: OB History     Gravida  2   Para  1   Term  1   Preterm  0   AB  0   Living  1      SAB  0   IAB  0   Ectopic  0   Multiple  0   Live Births  1           Social History: Social History   Socioeconomic History   Marital status: Married    Spouse name: Not on file   Number of children: Not on file   Years of education: Not on file   Highest education level: Not on file  Occupational History   Not on file  Tobacco Use   Smoking status: Never   Smokeless tobacco: Never  Vaping Use   Vaping status: Never Used  Substance and Sexual Activity   Alcohol use: No   Drug use: No   Sexual activity: Yes    Partners: Male    Birth control/protection: Condom  Other Topics Concern   Not on file  Social History Narrative   Not on file   Social Drivers of Health   Financial Resource Strain: Not on file  Food Insecurity: No Food Insecurity (08/25/2023)   Hunger Vital Sign    Worried About Running Out of Food in the Last Year: Never true    Ran Out of Food in the Last Year: Never true  Transportation Needs: No  Transportation Needs (08/25/2023)   PRAPARE - Administrator, Civil Service (Medical): No    Lack of Transportation (Non-Medical): No  Physical Activity: Not on file  Stress: Not on file  Social Connections: Not on file    Family History: Family History  Problem Relation Age of Onset   Diabetes Mother    Asthma Father    Hypertension Father    Asthma Brother    Cancer Maternal Uncle    Cancer Maternal Grandmother    Diabetes Paternal Grandmother     Allergies: No Known Allergies  Medications Prior to Admission  Medication Sig Dispense Refill Last Dose/Taking   aspirin  EC 81 MG tablet Take 1 tablet (81 mg total) by mouth daily. Swallow whole. 30 tablet 12 Past Week   Prenatal Vit-Fe Fumarate-FA (PRENATAL MULTIVITAMIN) TABS tablet Take 1 tablet by mouth daily at 12 noon.   09/17/2023   Blood Pressure Monitoring (BLOOD PRESSURE KIT) DEVI 1 each by Does not apply route as needed. (Patient not taking: Reported on 09/10/2023) 1 each 0      Review of Systems   All systems  reviewed and negative except as stated in HPI  Blood pressure 118/79, pulse 91, temperature 98.1 F (36.7 C), temperature source Oral, resp. rate 16, height 5\' 9"  (1.753 m), weight 119.4 kg, last menstrual period 11/13/2022, SpO2 98%, unknown if currently breastfeeding. General appearance:  Lungs: clear to auscultation bilaterally Heart: regular rate and rhythm Abdomen: soft, non-tender; bowel sounds normal Extremities: No calf swelling or tenderness Presentation: cephalic Fetal monitoring: 140s Uterine activity: quiet     Prenatal labs: ABO, Rh: --/--/O POS (04/30 1005) Antibody: NEG (04/30 1005) Rubella: 3.72 (10/22 1026) RPR: NON REACTIVE (04/30 0958)  HBsAg: Negative (10/22 1026)  HIV: Non Reactive (02/04 6387)  GBS: Negative/-- (04/08 0910)  2 hr Glucola: wnl Genetic screening:  wnl Anatomy US : wnl  Prenatal Transfer Tool  Maternal Diabetes: No Genetic Screening: Normal Maternal  Ultrasounds/Referrals: Normal Fetal Ultrasounds or other Referrals:  None Maternal Substance Abuse:  No Significant Maternal Medications:  None Significant Maternal Lab Results: Group B Strep negative  No results found for this or any previous visit (from the past 24 hours).  Patient Active Problem List   Diagnosis Date Noted   BMI 40.0-44.9, adult (HCC) 07/08/2023   History of gestational hypertension 03/10/2023   History of cesarean delivery 03/10/2023   Obesity in pregnancy 03/03/2023   Asthma 03/03/2023   Supervision of high risk pregnancy, antepartum 03/03/2023    Assessment: Danielle Padilla is a 28 y.o. G2P1001 at [redacted]w[redacted]d here for elective repeat cesarean section. First section for failed induction but upon review appears was actually elective (failed to progress with several doses of cytotec , declined further augmentation). She has previously been counseled regarding the risks and benefits of TOLAC, and I again reviewed those with her. She confirms her desire to proceed with elective repeat cesarean section. She desires an IUD for contraception; after discussion of pros and cons of post-placental placement, she elects to obtain outpatient.  The risks of cesarean section were discussed with the patient including but were not limited to: bleeding which may require transfusion or reoperation; infection which may require antibiotics; injury to bowel, bladder, ureters or other surrounding organs; injury to the fetus; need for additional procedures including hysterectomy in the event of a life-threatening hemorrhage; placental abnormalities wth subsequent pregnancies, incisional problems, thromboembolic phenomenon and other postoperative/anesthesia complications.  She declines permanent sterilization. Patient has been NPO since midnight she will remain NPO for procedure. Anesthesia and OR aware.  Preoperative prophylactic antibiotics and SCDs ordered on call to the OR.  To OR  when ready.  # feeding: breast  # contraception: interval IUD  # circ: n/a    Danielle Padilla 09/18/2023, 9:32 AM

## 2023-09-18 NOTE — Transfer of Care (Signed)
 Immediate Anesthesia Transfer of Care Note  Patient: Danielle Padilla  Procedure(s) Performed: CESAREAN DELIVERY  Patient Location: PACU  Anesthesia Type:Spinal  Level of Consciousness: awake, alert , and oriented  Airway & Oxygen Therapy: Patient Spontanous Breathing  Post-op Assessment: Report given to RN and Post -op Vital signs reviewed and stable  Post vital signs: Reviewed and stable  Last Vitals:  Vitals Value Taken Time  BP 103/59 09/18/23 1115  Temp    Pulse 79 09/18/23 1118  Resp 16 09/18/23 1118  SpO2 100 % 09/18/23 1118  Vitals shown include unfiled device data.  Last Pain:  Vitals:   09/18/23 1114  TempSrc: (P) Oral      Patients Stated Pain Goal: 5 (09/18/23 1610)  Complications: No notable events documented.

## 2023-09-18 NOTE — Lactation Note (Signed)
 This note was copied from a baby's chart. Lactation Consultation Note  Patient Name: Danielle Padilla Today's Date: 09/18/2023 Age:27 hoursm 2nd LC visit this evening  Reason for consult: Follow-up assessment;Mother's request;Term;Breastfeeding assistance Baby woke up after diaper check,it was dry.  LC offered to assist and mom receptive. Attempted to latch STS on the left breast football, and latched for a few seconds. LC reviewed hand expressing and was able to express 2.5 ml and spoon fed it to the baby gradually . The baby would suck on the LC's gloved finger. In between spoon feeding.  LC reassured mom and dad baby is in the early stages of breast feeding and HNS so the baby isn't overly hungry.   Maternal Data Has patient been taught Hand Expression?: Yes (able to hand express off 2.5 ml and spoon feed it to the baby) Does the patient have breastfeeding experience prior to this delivery?: Yes How long did the patient breastfeed?: per mom 1st baby was in NICU and would never really latch so she pumped x 6 months, 1st baby is now 16 months old and its only been 8 months since she last pumped Mom holding baby and LC recommended trying in another hour or 2 .  Feeding Mother's Current Feeding Choice: Breast Milk  LATCH Score Latch: Too sleepy or reluctant, no latch achieved, no sucking elicited. Latched few seconds no suck  Audible Swallowing: None  Type of Nipple: Everted at rest and after stimulation (areola compressible)  Comfort (Breast/Nipple): Soft / non-tender  Hold (Positioning): Assistance needed to correctly position infant at breast and maintain latch.  LATCH Score: 5   Lactation Tools Discussed/Used  Spoon   Interventions Interventions: Breast feeding basics reviewed;Assisted with latch;Skin to skin;Breast massage;Hand express;Reverse pressure;Adjust position;Support pillows;Education;LC Services brochure;CDC milk storage guidelines;CDC Guidelines for Breast  Pump Cleaning  Discharge Pump: Personal;DEBP  Consult Status Consult Status: Follow-up Date: 09/19/23 Follow-up type: In-patient    Danielle Padilla 09/18/2023, 6:00 PM

## 2023-09-18 NOTE — Anesthesia Postprocedure Evaluation (Signed)
 Anesthesia Post Note  Patient: Danielle Padilla  Procedure(s) Performed: CESAREAN DELIVERY     Patient location during evaluation: PACU Anesthesia Type: Spinal Level of consciousness: awake and alert Pain management: pain level controlled Vital Signs Assessment: post-procedure vital signs reviewed and stable Respiratory status: spontaneous breathing, nonlabored ventilation and respiratory function stable Cardiovascular status: blood pressure returned to baseline Postop Assessment: no apparent nausea or vomiting, spinal receding, no headache and no backache Anesthetic complications: no   No notable events documented.  Last Vitals:  Vitals:   09/18/23 1130 09/18/23 1200  BP: 109/76   Pulse: 83   Resp: 16   Temp:  (P) 36.4 C  SpO2: 100%     Last Pain:  Vitals:   09/18/23 1200  TempSrc:   PainSc: (P) 0-No pain   Pain Goal: Patients Stated Pain Goal: 0 (09/18/23 1115)   Rayfield [place]

## 2023-09-18 NOTE — Lactation Note (Signed)
 This note was copied from a baby's chart. Lactation Consultation Note  Patient Name: Danielle Padilla [id number] Date: 09/18/2023 Age:27 hours Reason for consult: Initial assessment;Mother's request;Term;Maternal endocrine disorder (Since mom recently attempted to feed, LC recommended to call with feeding cues or by 5 pm for assistance) Per mom last attempted to feed at 3:30 pm and the baby wasn't interested. Dad presently holding baby and she is sound asleep.  LC reviewed 24 hours breast feeding goals to feed with cues and by 3 hours. Since the baby hasn't fed since 1120 am , LC asked mom to call after her lunch for [facility] for assistance.   Maternal Data Does the patient have breastfeeding experience prior to this delivery?: Yes How long did the patient breastfeed?: per mom 1st baby was in NICU and would never really latch so she pumped x 6 months, 1st baby is now 56 months old and its only been 8 months since she last pumped  Feeding Mother's Current Feeding Choice: Breast Milk  LATCH Score Latch: Too sleepy or reluctant, no latch achieved, no sucking elicited.  Audible Swallowing: None  Type of Nipple: Flat  Comfort (Breast/Nipple): Soft / non-tender  Hold (Positioning): Assistance needed to correctly position infant at breast and maintain latch.  LATCH Score: 4   Interventions  Education   Discharge Pump: Personal;DEBP (per mom DEBP Motif)  Consult Status Consult Status: Follow-up Date: 09/18/23 Follow-up type: In-patient    [name] Dennis Hegeman 09/18/2023, 4:22 PM

## 2023-09-18 NOTE — Op Note (Signed)
 Cesarean Section Operative Report  [name]  09/18/2023  Indications: history one prior cesarean, maternal request  Pre-operative Diagnosis: repeat low transverse cesarean section  Post-operative Diagnosis: Same   Surgeon: Surgeons and Role:    * Tommye Lehenbauer, Haynes Lips, MD - Primary    * Melanie Spires, MD - Fellow   Attending Attestation: I was present and scrubbed for the entire procedure.   An experienced assistant was required given the standard of surgical care given the complexity of the case.  This assistant was needed for exposure, dissection, suctioning, retraction, instrument exchange, assisting with delivery with administration of fundal pressure, and for overall help during the procedure.  Anesthesia: spinal    Quantified Blood Loss: 404 ml  Total IV Fluids: 1500 ml LR  Urine Output:: 50 ml clear yellow urine  Specimens: none  Findings: Viable female infant in cephalic presentation; Apgars pending; weight pending; arterial cord pH not obtained;  clear amniotic fluid; intact placenta with three vessel cord; normal uterus, fallopian tubes and ovaries bilaterally.  Baby condition / location:  Couplet care / Skin to Skin   Complications: no complications  Indications: Danielle Padilla is a 28 y.o. [phone] with an IUP [redacted]w[redacted]d presenting for elective repeat cesarean section, declining TOLAC.  The risks, benefits, complications, treatment options, and exected outcomes were discussed with the patient . The patient dwith the proposed plan, giving informed consent. identified as [name] and the procedure verified as C-Section Delivery.  Procedure Details:  The patient was taken back to the operative suite where spinal anesthesia was placed.  A time out was held and the above information confirmed.   After induction of anesthesia, the patient was draped and prepped in the usual sterile manner and placed in a  dorsal supine position with a leftward tilt. A Pfannenstiel incision was made and carried down through the subcutaneous tissue to the fascia. Fascial incision was made and bluntly extended transversely. The fascia was separated from the underlying rectus tissue superiorly and inferiorly. The peritoneum was identified and sharply entered and extended longitudinally. Alexis retractor was placed. A bladder flap was created. A low transverse uterine incision was made and extended bluntly. Delivered from cephalic presentation with vacuum-assistance (no pop-offs) was a viable infant with Apgars and weight as above.  After waiting 60 seconds for delayed cord cutting, the umbilical cord was clamped and cut cord blood was obtained for evaluation. Cord ph was not sent. The placenta was removed Intact and appeared normal. The uterine outline, tubes and ovaries appeared normal. The uterine incision was closed with running unlocked sutures 0-Vicryl in one layer.   Hemostasis was observed. The peritoneum was closed with 2-0 vicryl. The rectus muscles were examined and hemostasis observed. The fascia was then reapproximated with running sutures of 0-Vicryl. The subcuticular closure was performed with 2-0 plain gut. The skin was closed with 4-0 Vicryl.  Instrument, sponge, and needle counts were correct prior the abdominal closure and were correct at the conclusion of the case.     Disposition: PACU - hemodynamically stable.   Maternal Condition: stable       Signed: [name], MD 09/18/2023 10:50 AM

## 2023-09-19 LAB — CBC
HCT: 27.5 % — ABNORMAL LOW (ref 36.0–46.0)
Hemoglobin: 9.3 g/dL — ABNORMAL LOW (ref 12.0–15.0)
MCH: 29.4 pg (ref 26.0–34.0)
MCHC: 33.8 g/dL (ref 30.0–36.0)
MCV: 87 fL (ref 80.0–100.0)
Platelets: 243 10*3/uL (ref 150–400)
RBC: 3.16 MIL/uL — ABNORMAL LOW (ref 3.87–5.11)
RDW: 13 % (ref 11.5–15.5)
WBC: 9.4 10*3/uL (ref 4.0–10.5)
nRBC: 0 % (ref 0.0–0.2)

## 2023-09-19 MED ORDER — FERROUS GLUCONATE 324 (38 FE) MG PO TABS
324.0000 mg | ORAL_TABLET | ORAL | Status: DC
  Administered 2023-09-19: 324 mg via ORAL
  Filled 2023-09-19: qty 1

## 2023-09-19 NOTE — Progress Notes (Signed)
 POSTPARTUM PROGRESS NOTE  Subjective: Danielle Padilla Larelle Arbon is a 28 y.o. G2P2002 s/p rLTCS at [redacted]w[redacted]d.  She reports she is doing well. No acute events overnight. She denies any problems with ambulating, voiding or po intake. Denies nausea or vomiting. She has not passed flatus. Pain is well controlled.  Lochia is moderate.  Objective: Blood pressure 106/67, pulse 79, temperature 98.2 F (36.8 C), temperature source Axillary, resp. rate 18, height 5\' 9"  (1.753 m), weight 119.4 kg, last menstrual period 11/13/2022, SpO2 99%, unknown if currently breastfeeding.  Physical Exam:  General: alert, cooperative and no distress Chest: no respiratory distress Abdomen: soft, non-tender  Uterine Fundus: firm and at level of umbilicus Extremities: No calf swelling or tenderness  Trace edema  Recent Labs    09/16/23 0958 09/19/23 0449  HGB 11.5* 9.3*  HCT 33.6* 27.5*    Assessment/Plan: [facility] Danielle Padilla is a 28 y.o. [id number] s/p elective rLTCS at [redacted]w[redacted]d.  Routine Postpartum Care: Doing well, pain well-controlled.  -- Continue routine care, lactation support  -- Contraception: IUD OP -- Feeding: breast  Dispo: Plan for discharge 5/4-5/5.  Maud Sorenson, MD OB Fellow 09/19/2023 7:40 AM

## 2023-09-20 MED ORDER — IBUPROFEN 600 MG PO TABS: 600.0000 mg | ORAL_TABLET | Freq: Four times a day (QID) | ORAL | 0 refills | Status: AC

## 2023-09-20 MED ORDER — FERROUS GLUCONATE 324 (38 FE) MG PO TABS: 324.0000 mg | ORAL_TABLET | ORAL | 0 refills | Status: AC

## 2023-09-20 MED ORDER — ACETAMINOPHEN 500 MG PO TABS: 1000.0000 mg | ORAL_TABLET | Freq: Four times a day (QID) | ORAL | 0 refills | Status: AC

## 2023-09-20 MED ORDER — SENNOSIDES-DOCUSATE SODIUM 8.6-50 MG PO TABS: 2.0000 | ORAL_TABLET | ORAL | 0 refills | Status: AC

## 2023-09-20 MED ORDER — OXYCODONE HCL 5 MG PO TABS: 5.0000 mg | ORAL_TABLET | Freq: Four times a day (QID) | ORAL | 0 refills | Status: AC | PRN

## 2023-09-21 ENCOUNTER — Encounter: Admitting: Family Medicine

## 2023-09-30 ENCOUNTER — Ambulatory Visit

## 2023-09-30 ENCOUNTER — Other Ambulatory Visit: Payer: Self-pay

## 2023-09-30 VITALS — BP 128/86 | HR 70 | Wt 249.4 lb

## 2023-09-30 DIAGNOSIS — Z5189 Encounter for other specified aftercare: Secondary | ICD-10-CM

## 2023-09-30 NOTE — Progress Notes (Signed)
 Incision Check Visit  Danielle Padilla Danielle Padilla is here for incision check following repeat c-section on 09/18/23. Incision is open to air, appears clean, dry, and fully intact. No longer needing pain medication. Reviewed good wound care and s/s of infection with patient. Unable to fill iron  supplement, was told this was out of stock. [facility], they will prepare for patient to pick up.  Reports breastfeeding concern-- baby is unable to latch consistently. Currently pumping and supplementing with formula. Would like to see lactation for support.  Patient will follow up postpartum visit 11/02/23 with Cresenzo, MD.  Fonda Hymen, RN 09/30/2023  10:35 AM

## 2023-11-02 ENCOUNTER — Ambulatory Visit (INDEPENDENT_AMBULATORY_CARE_PROVIDER_SITE_OTHER): Payer: Self-pay | Admitting: Family Medicine

## 2023-11-02 ENCOUNTER — Other Ambulatory Visit: Payer: Self-pay

## 2023-11-02 ENCOUNTER — Other Ambulatory Visit (HOSPITAL_COMMUNITY)
Admission: RE | Admit: 2023-11-02 | Discharge: 2023-11-02 | Disposition: A | Discharge status: Home / Self Care | Source: Ambulatory Visit | Attending: Family Medicine | Admitting: Family Medicine

## 2023-11-02 ENCOUNTER — Encounter: Payer: Self-pay | Admitting: Family Medicine

## 2023-11-02 DIAGNOSIS — Z30430 Encounter for insertion of intrauterine contraceptive device: Secondary | ICD-10-CM

## 2023-11-02 MED ORDER — LEVONORGESTREL 20 MCG/DAY IU IUD
1.0000 | INTRAUTERINE_SYSTEM | Freq: Once | INTRAUTERINE | Status: AC
Start: 2023-11-02 — End: 2023-11-02
  Administered 2023-11-02: 1 via INTRAUTERINE

## 2023-11-02 NOTE — Progress Notes (Signed)
 Post Partum Visit Note  Danielle Padilla is a 28 y.o. [phone] female who presents for a postpartum visit. She is 6 weeks postpartum following a repeat cesarean section.  I have fully reviewed the prenatal and intrapartum course. The delivery was at [redacted]w[redacted]d gestational weeks.  Anesthesia: spinal. Postpartum course has been good. Baby is doing well. Baby was hospitalized at 14 weeks old for UTI treatment, but is home now and doing well. Baby is feeding by both breast and bottle - Similac Advance. Bleeding no bleeding. Bowel function is normal. Bladder function is normal. Patient is not sexually active. Contraception method is IUD. Postpartum depression screening: negative.   Upstream - 11/02/23 1347       Pregnancy Intention Screening   Does the patient want to become pregnant in the next year? No    Does the patient's partner want to become pregnant in the next year? No    Would the patient like to discuss contraceptive options today? Yes      Contraception Wrap Up   Current Method Abstinence    End Method IUD or IUS    Contraception Counseling Provided Yes    How was the end contraceptive method provided? Provided on site         The pregnancy intention screening data noted above was reviewed. Potential methods of contraception were discussed. The patient elected to proceed with IUD or IUS.   Edinburgh Postnatal Depression Scale - 11/02/23 1345       Edinburgh Postnatal Depression Scale:  In the Past 7 Days   I have been able to laugh and see the funny side of things. 0    I have looked forward with enjoyment to things. 0    I have blamed myself unnecessarily when things went wrong. 0    I have been anxious or worried for no good reason. 0    I have felt scared or panicky for no good reason. 0    Things have been getting on top of me. 0    I have been so unhappy that I have had difficulty sleeping. 0    I have felt sad or miserable. 0    I have been so unhappy  that I have been crying. 0    The thought of harming myself has occurred to me. 0    Edinburgh Postnatal Depression Scale Total 0          Health Maintenance Due  Topic Date Due   HPV VACCINES (1 - 3-dose series) Never done   Pneumococcal Vaccine 82-93 Years old (1 of 2 - PCV) Never done   COVID-19 Vaccine (1 - 2024-25 season) Never done    The following portions of the patient's history were reviewed and updated as appropriate: allergies, current medications, past family history, past medical history, past social history, past surgical history, and problem list.  Review of Systems Pertinent items are noted in HPI.  Objective:  BP 116/82   Pulse 85   Wt 249 lb 7 oz (113.1 kg)   LMP 11/13/2022 (Approximate)   Breastfeeding Yes   BMI 36.84 kg/m    General:  alert, cooperative, and appears stated age   Breasts:  normal  Lungs: Normal work of breathing, speaking in full sentences  Heart:  Regular rate  Abdomen: soft, non-tender; bowel sounds normal; no masses,  no organomegaly   Wound Healing well  GU exam:  Normal female genitalia, normal cervix, after IUD was placed  did notice small to moderate amount of discharge from the cervix   IUD Insertion Procedure Note Patient identified, informed consent performed, consent signed.   Discussed risks of irregular bleeding, cramping, infection, malpositioning or misplacement of the IUD outside the uterus which may require further procedure such as laparoscopy. Time out was performed.  Urine pregnancy test negative.  Speculum placed in the vagina.  Cervix visualized.  Cleaned with Betadine  x 2.  Grasped anteriorly with a single tooth tenaculum.  Uterus sounded to 9 cm.  Liletta IUD placed per manufacturer's recommendations.  Strings trimmed to 3 cm. Tenaculum was removed, good hemostasis noted.  Patient tolerated procedure well.   Patient was given post-procedure instructions.  She was advised to have backup contraception for one week.   Patient was also asked to check IUD strings periodically and follow up in 4 weeks for IUD check.   LOT [id number] 2027 Assessment:    There are no diagnoses linked to this encounter.  Normal postpartum exam.   Plan:   Essential components of care per ACOG recommendations:  1.  Mood and well being: Patient with negative depression screening today. Reviewed local resources for support.  - Patient tobacco use? No.   - hx of drug use? No.    2. Infant care and feeding:  -Patient currently breastmilk feeding? Yes. Discussed returning to work and pumping.  -Social determinants of health (SDOH) reviewed in EPIC. No concerns  3. Sexuality, contraception and birth spacing - Patient does not want a pregnancy in the next year.  Desired family size is 2 children.  - Reviewed reproductive life planning. Reviewed contraceptive methods based on pt preferences and effectiveness.  Patient desired IUD or IUS today.   - Discussed birth spacing of 18 months  4. Sleep and fatigue -Encouraged family/partner/community support of 4 hrs of uninterrupted sleep to help with mood and fatigue  5. Physical Recovery  - Discussed patients delivery and complications. She describes her labor as good. - Patient had a C-section.  - Patient has urinary incontinence? No. - Patient is safe to resume physical and sexual activity  6.  Health Maintenance - HM due items addressed Yes - Last pap smear No results found for: DIAGPAP Pap smear not done at today's visit.  -Breast Cancer screening indicated? No.   7. Chronic Disease/Pregnancy Condition follow up: Hypertension  - PCP follow up  Ferdie Housekeeper, MD [facility], [facility]

## 2023-11-04 LAB — CERVICOVAGINAL ANCILLARY ONLY
Bacterial Vaginitis (gardnerella): POSITIVE — AB
Candida Glabrata: NEGATIVE
Candida Vaginitis: NEGATIVE
Comment: NEGATIVE
Comment: NEGATIVE
Comment: NEGATIVE

## 2023-11-05 ENCOUNTER — Ambulatory Visit: Payer: Self-pay | Admitting: Family Medicine

## 2023-11-05 MED ORDER — METRONIDAZOLE 500 MG PO TABS: 500.0000 mg | ORAL_TABLET | Freq: Two times a day (BID) | ORAL | 0 refills | Status: AC

## 2024-02-02 IMAGING — US US BREAST*L* LIMITED INC AXILLA
1 series · 9 of 9 positions shown · non-contrast
Comparison: None.

CLINICAL DATA: Palpable lump in the left breast felt by the
patient's physician. Palpable lump in the left breast which waxes
and wanes with her cycle at 8 o'clock, felt by the patient.

EXAM:
ULTRASOUND OF THE LEFT BREAST

[Series 1: us breast*left* limited inc axilla · 0.07mm/px · 9 of 9 slices shown]
[im 1/9]
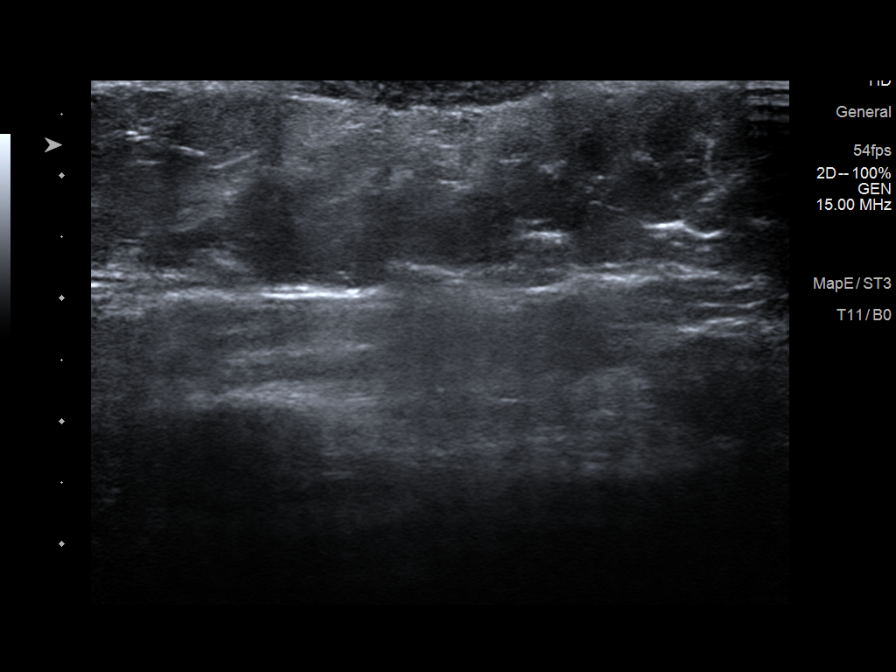
[im 2/9]
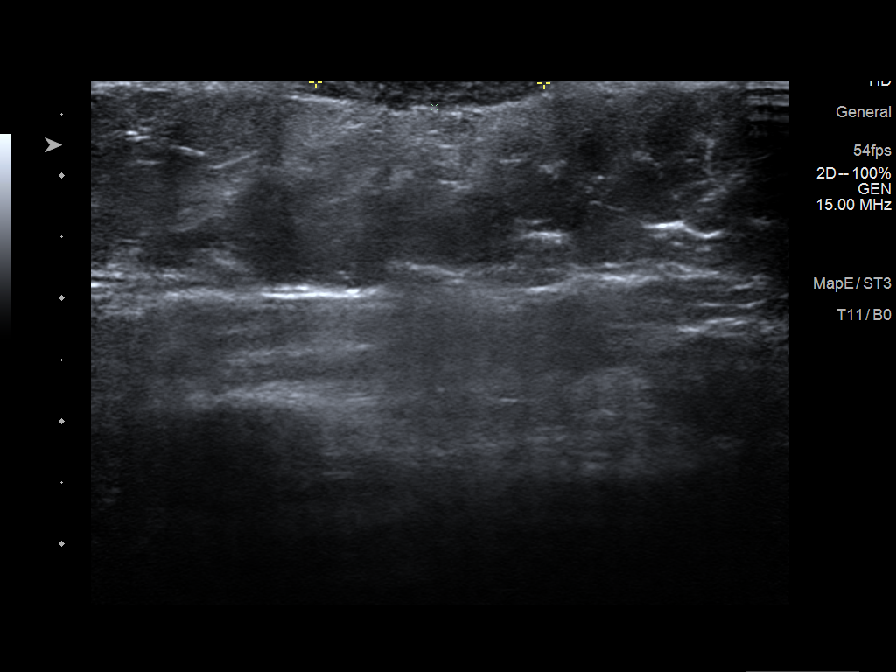
[im 3/9]
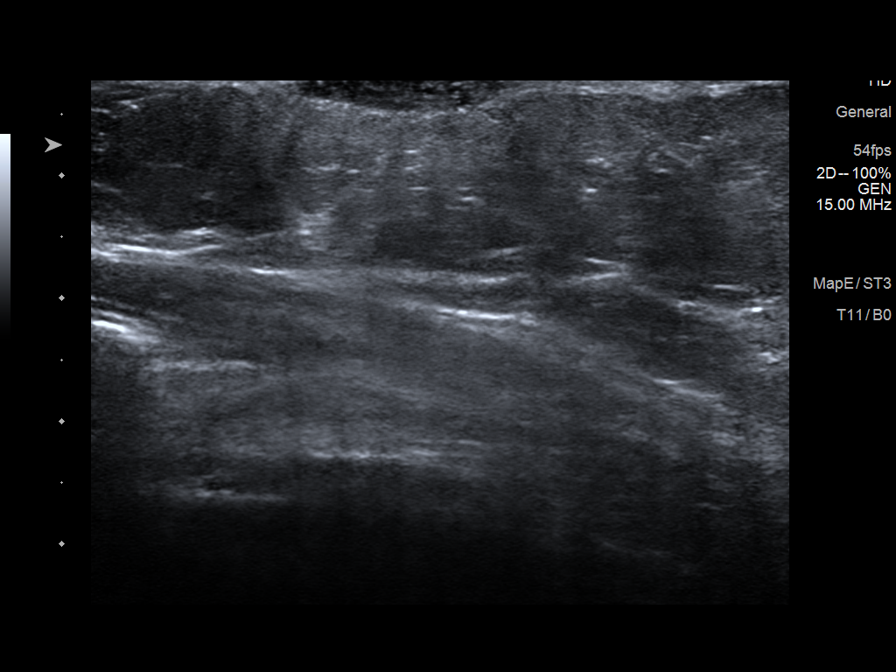
[im 4/9]
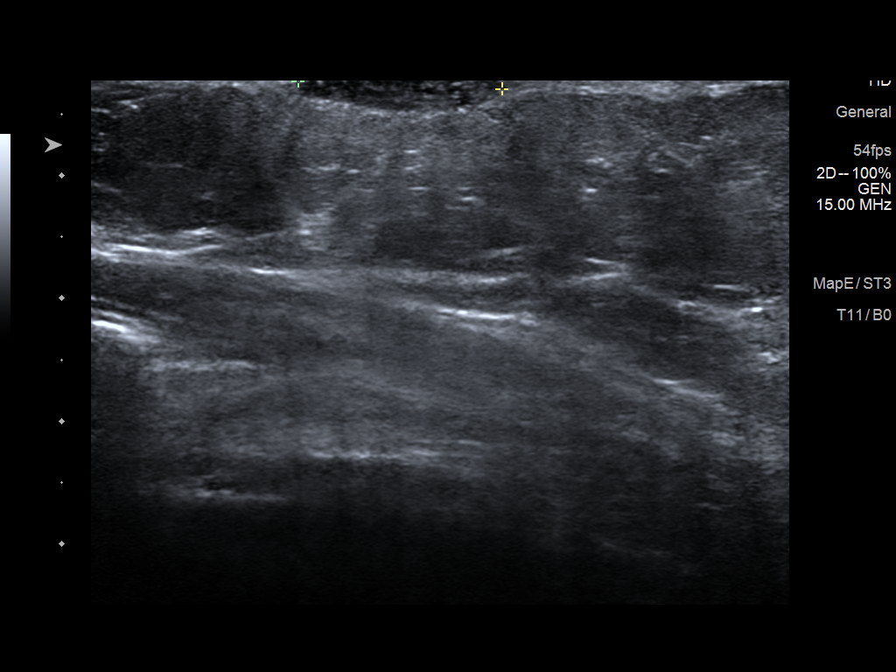
[im 5/9]
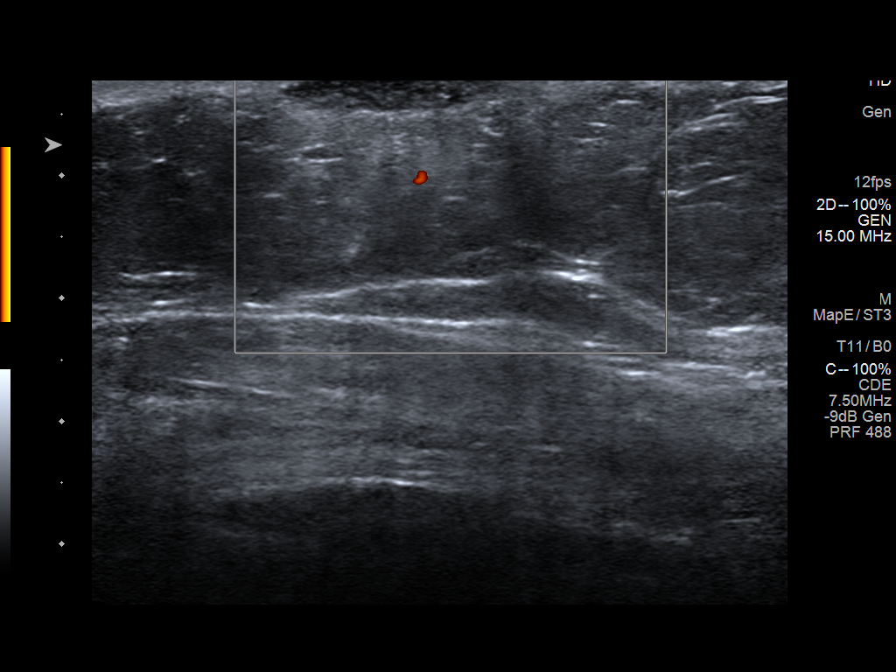
[im 6/9]
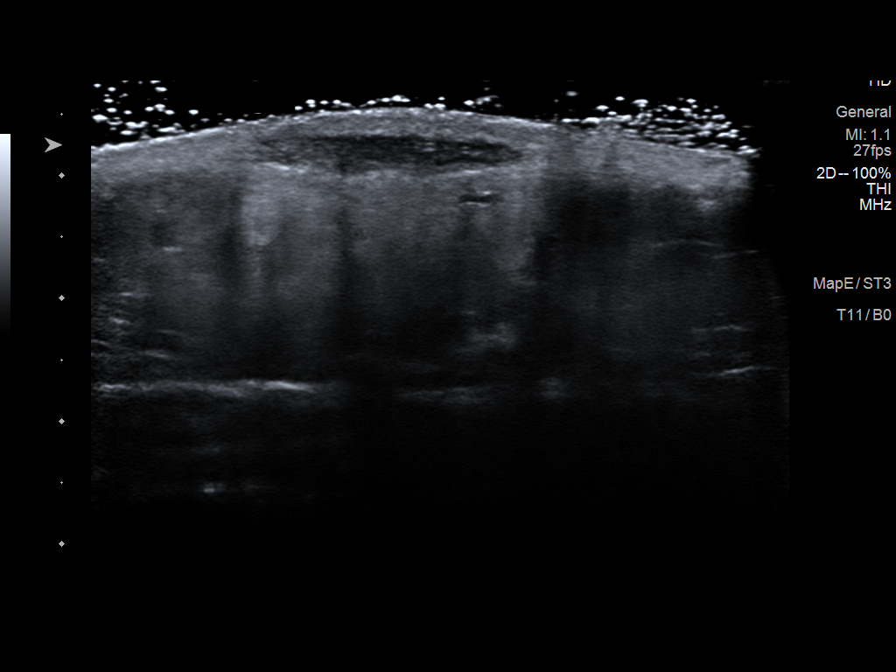
[im 7/9]
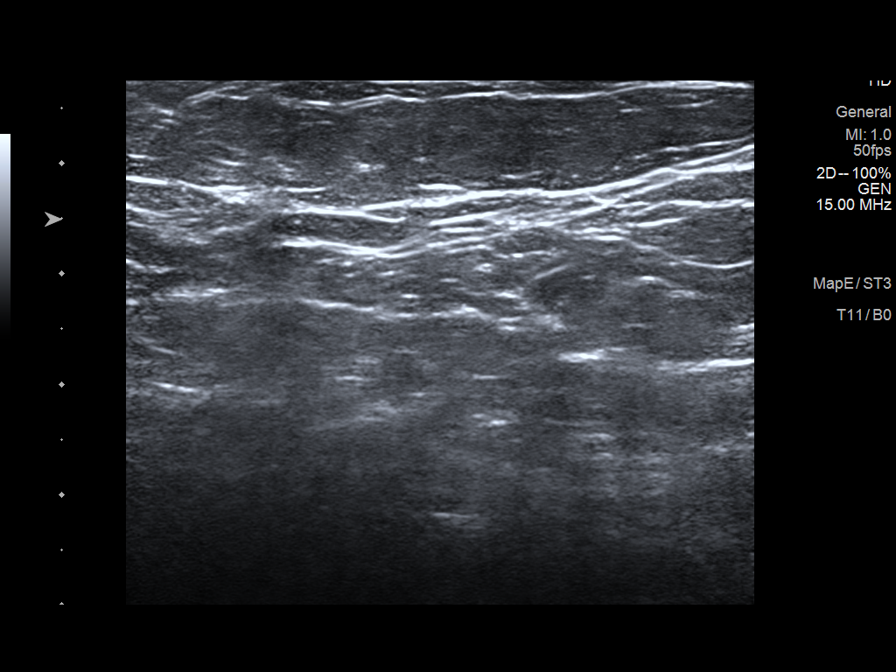
[im 8/9]
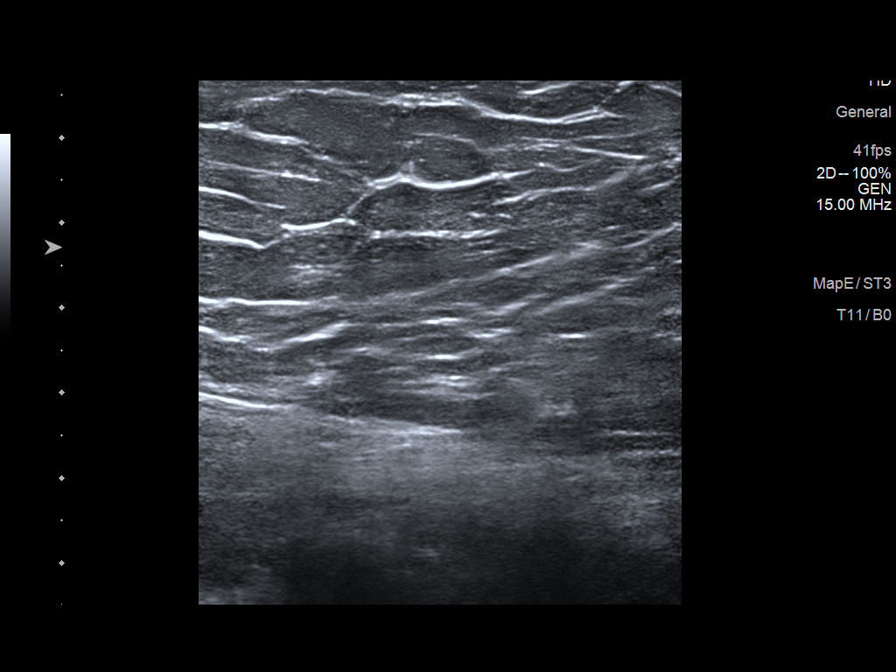
[im 9/9]
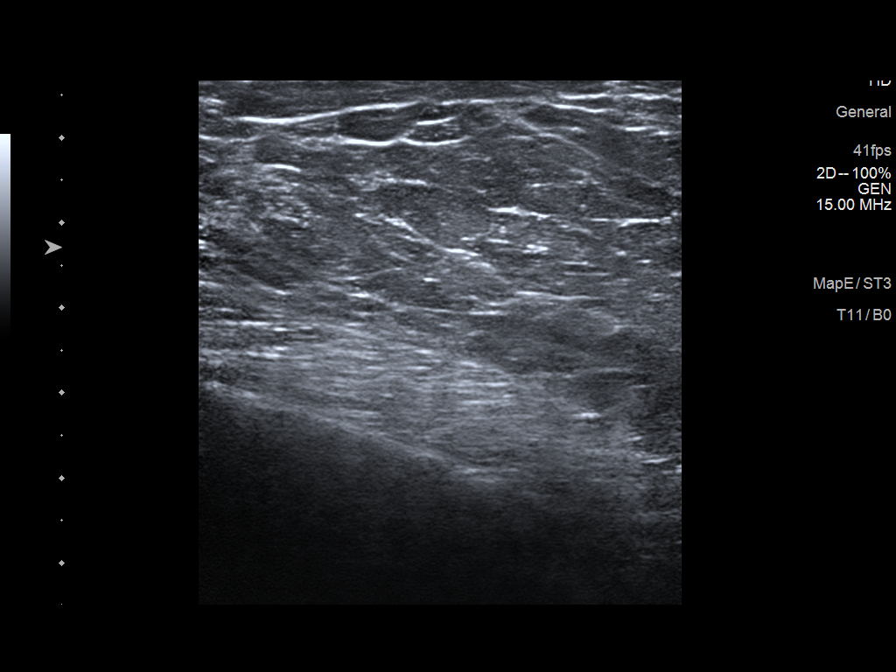

[9 of 9 positions shown; findings below may reference images not displayed]

FINDINGS: Targeted ultrasound is performed, showing no abnormalities in the
region of the palpable lump felt by the patient's physician.

There is a sebaceous cyst measuring 19 x 3 x 17 mm correlating with
the palpable lump felt by the patient.
IMPRESSION: The patient is palpating a sebaceous cyst as above. No other
abnormalities identified in the region of the patient's symptoms.

RECOMMENDATION:
Annual screening mammography beginning at the age of 40.

I have discussed the findings and recommendations with the patient.
If applicable, a reminder letter will be sent to the patient
regarding the next appointment.

BI-RADS CATEGORY  2: Benign.
